# Patient Record
Sex: Male | Born: 1975 | Race: White | Hispanic: No | Marital: Single | Smoking: Current every day smoker
Health system: Southern US, Community
[De-identification: ages and names within clinical notes are randomized; demographics above are authoritative.]

## PROBLEM LIST (undated history)

## (undated) DIAGNOSIS — F1910 Other psychoactive substance abuse, uncomplicated: Secondary | ICD-10-CM

## (undated) DIAGNOSIS — Z789 Other specified health status: Secondary | ICD-10-CM

---

## 2000-08-26 ENCOUNTER — Encounter: Payer: Self-pay | Admitting: Emergency Medicine

## 2000-08-26 ENCOUNTER — Emergency Department (HOSPITAL_COMMUNITY): Admission: EM | Admit: 2000-08-26 | Discharge: 2000-08-27 | Payer: Self-pay | Admitting: Emergency Medicine

## 2012-03-14 ENCOUNTER — Encounter (HOSPITAL_COMMUNITY): Payer: Self-pay | Admitting: *Deleted

## 2012-03-14 ENCOUNTER — Emergency Department (HOSPITAL_COMMUNITY): Payer: Self-pay

## 2012-03-14 ENCOUNTER — Emergency Department (HOSPITAL_COMMUNITY)
Admission: EM | Admit: 2012-03-14 | Discharge: 2012-03-15 | Disposition: A | Discharge status: Home / Self Care | Payer: Self-pay | Attending: Emergency Medicine | Admitting: Emergency Medicine

## 2012-03-14 DIAGNOSIS — S20219A Contusion of unspecified front wall of thorax, initial encounter: Secondary | ICD-10-CM | POA: Insufficient documentation

## 2012-03-14 DIAGNOSIS — F17200 Nicotine dependence, unspecified, uncomplicated: Secondary | ICD-10-CM | POA: Insufficient documentation

## 2012-03-14 DIAGNOSIS — W1809XA Striking against other object with subsequent fall, initial encounter: Secondary | ICD-10-CM | POA: Insufficient documentation

## 2012-03-14 NOTE — ED Notes (Signed)
Pt states was on ladder and the ladder slipped, pt fell onto a handrail injuring bilateral chest. Pt states pain has worsened since fall x 2 days ago. Pt states sharp pain on R side w/ inspiration. Pt able to raise arms over head and is in mild acute distress, no respiratory distress at this time.

## 2012-03-15 MED ORDER — OXYCODONE-ACETAMINOPHEN 5-325 MG PO TABS: 2.0000 | ORAL_TABLET | Freq: Three times a day (TID) | ORAL | Status: AC | PRN

## 2012-03-15 NOTE — ED Notes (Signed)
Pt ambulates to room from triage, no s/s of acute distress or discomfort noted

## 2012-03-15 NOTE — ED Notes (Signed)
Placed fall risk bracelet on pt. Shoes remained on pt.

## 2012-03-15 NOTE — ED Provider Notes (Signed)
History     CSN: [id number]  Arrival date & time 03/14/12  2000   First MD Initiated Contact with Patient 03/15/12 0055      Chief Complaint  Patient presents with  . Fall    (Consider location/radiation/quality/duration/timing/severity/associated sxs/prior treatment) HPI This 36 year old male was trimming his mom's pushes well balancing on a ladder that slipped on a stair railing and started to fall over causing the patient to fall onto his anterior chest under the stair railing 2 days ago. He is anterior chest wall tenderness since that time. He no head or neck injury no amnesia no syncope. Is no neck pain back pain shortness breath or abdominal pain or pain to his extremities. He is no weakness or numbness or other concerns. Is not short of breath. His chest wall is moderately severe in terms of his pain and tenderness worse with position changes and no treatment prior to arrival. He is having difficulty sleeping at night and wants pain medicine to help sleep at night. History reviewed. No pertinent past medical history.  History reviewed. No pertinent past surgical history.  History reviewed. No pertinent family history.  History  Substance Use Topics  . Smoking status: Current Everyday Smoker -- 1.0 packs/day    Types: Cigarettes  . Smokeless tobacco: Not on file  . Alcohol Use: No      Review of Systems   10 Systems reviewed and are negative for acute change except as noted in the HPI. Allergies  Review of patient's allergies indicates no known allergies.  Home Medications   Current Outpatient Rx  Name Route Sig Dispense Refill  . OXYCODONE-ACETAMINOPHEN 5-325 MG PO TABS Oral Take 2 tablets by mouth every 8 (eight) hours as needed for pain. 20 tablet 0    BP 144/91  Pulse 76  Temp 98.8 F (37.1 C) (Oral)  Resp 18  SpO2 100%  Physical Exam  Nursing note and vitals reviewed. Constitutional:       Awake, alert, nontoxic appearance.  HENT:  Head: Atraumatic.   Eyes: Right eye exhibits no discharge. Left eye exhibits no discharge.  Neck: Neck supple.  Cardiovascular: Normal rate and regular rhythm.   No murmur heard. Pulmonary/Chest: Effort normal and breath sounds normal. No respiratory distress. He has no wheezes. He has no rales. He exhibits tenderness.       Bilateral anterior chest wall tenderness without instability or flail chest palpated and no external bruising noted  Abdominal: Soft. There is no tenderness. There is no rebound.  Musculoskeletal: He exhibits no tenderness.       Baseline ROM, no obvious new focal weakness.  Neurological:       Mental status and motor strength appears baseline for patient and situation.  Skin: No rash noted.  Psychiatric: He has a normal mood and affect.    ED Course  Procedures (including critical care time)  Labs Reviewed - No data to display Dg Chest 2 View  03/14/2012  *RADIOLOGY REPORT*  Clinical Data: Fall.  Left lateral side pain.  Bilateral pain. History of smoking.  CHEST - 2 VIEW  Comparison: None.  Findings: There is no pneumothorax.  No displaced rib fracture is identified.  Cardiopericardial silhouette is within normal limits. Basilar atelectasis.  Mild blunting of the right costophrenic angle may be due to scarring or atelectasis.  There is no pleural fluid identified on the lateral view.  Grossly, thoracic vertebral body height appears preserved.  Mediastinal contours are within normal limits.  Paraspinal  lines are normal.  IMPRESSION: No acute cardiopulmonary disease.  Mild basilar atelectasis.  Original Report Authenticated By: Andreas Newport, M.D.     1. Chest wall contusion       MDM  Pt stable in ED with no significant deterioration in condition.Patient / Family / Caregiver informed of clinical course, understand medical decision-making process, and agree with plan.         Hurman Horn, MD 03/15/12 [phone]

## 2014-12-29 ENCOUNTER — Emergency Department (HOSPITAL_COMMUNITY): Payer: Self-pay

## 2014-12-29 ENCOUNTER — Emergency Department (HOSPITAL_COMMUNITY)
Admission: EM | Admit: 2014-12-29 | Discharge: 2014-12-29 | Disposition: A | Discharge status: Home / Self Care | Payer: Self-pay | Attending: Emergency Medicine | Admitting: Emergency Medicine

## 2014-12-29 ENCOUNTER — Encounter (HOSPITAL_COMMUNITY): Payer: Self-pay | Admitting: Emergency Medicine

## 2014-12-29 DIAGNOSIS — Z720 Tobacco use: Secondary | ICD-10-CM | POA: Insufficient documentation

## 2014-12-29 DIAGNOSIS — R4182 Altered mental status, unspecified: Secondary | ICD-10-CM | POA: Insufficient documentation

## 2014-12-29 DIAGNOSIS — S01311A Laceration without foreign body of right ear, initial encounter: Secondary | ICD-10-CM | POA: Insufficient documentation

## 2014-12-29 DIAGNOSIS — Y998 Other external cause status: Secondary | ICD-10-CM | POA: Insufficient documentation

## 2014-12-29 DIAGNOSIS — Y9289 Other specified places as the place of occurrence of the external cause: Secondary | ICD-10-CM | POA: Insufficient documentation

## 2014-12-29 DIAGNOSIS — Y288XXA Contact with other sharp object, undetermined intent, initial encounter: Secondary | ICD-10-CM | POA: Insufficient documentation

## 2014-12-29 DIAGNOSIS — Y9389 Activity, other specified: Secondary | ICD-10-CM | POA: Insufficient documentation
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
## 2014-12-29 NOTE — Discharge Instructions (Signed)
Your laceration was not sutured per urethra last use date her tetanus immunization is up-to-date your head CT is normal.  [facility] [facility]

## 2019-07-18 ENCOUNTER — Emergency Department (HOSPITAL_BASED_OUTPATIENT_CLINIC_OR_DEPARTMENT_OTHER): Payer: Self-pay

## 2019-07-18 ENCOUNTER — Encounter (HOSPITAL_BASED_OUTPATIENT_CLINIC_OR_DEPARTMENT_OTHER): Payer: Self-pay | Admitting: Emergency Medicine

## 2019-07-18 ENCOUNTER — Inpatient Hospital Stay (HOSPITAL_BASED_OUTPATIENT_CLINIC_OR_DEPARTMENT_OTHER)
Admission: EM | Admit: 2019-07-18 | Discharge: 2019-07-19 | DRG: 917 | Discharge status: Against Medical Advice | Payer: Self-pay | Attending: Internal Medicine | Admitting: Internal Medicine

## 2019-07-18 DIAGNOSIS — G92 Toxic encephalopathy: Secondary | ICD-10-CM | POA: Diagnosis present

## 2019-07-18 DIAGNOSIS — E875 Hyperkalemia: Secondary | ICD-10-CM | POA: Diagnosis present

## 2019-07-18 DIAGNOSIS — F17210 Nicotine dependence, cigarettes, uncomplicated: Secondary | ICD-10-CM | POA: Diagnosis present

## 2019-07-18 DIAGNOSIS — G9340 Encephalopathy, unspecified: Secondary | ICD-10-CM | POA: Diagnosis present

## 2019-07-18 DIAGNOSIS — T50904A Poisoning by unspecified drugs, medicaments and biological substances, undetermined, initial encounter: Principal | ICD-10-CM | POA: Diagnosis present

## 2019-07-18 DIAGNOSIS — R0902 Hypoxemia: Secondary | ICD-10-CM | POA: Diagnosis present

## 2019-07-18 DIAGNOSIS — J690 Pneumonitis due to inhalation of food and vomit: Secondary | ICD-10-CM | POA: Diagnosis present

## 2019-07-18 DIAGNOSIS — Z20828 Contact with and (suspected) exposure to other viral communicable diseases: Secondary | ICD-10-CM | POA: Diagnosis present

## 2019-07-18 HISTORY — DX: Other specified health status: Z78.9

## 2019-07-18 LAB — SALICYLATE LEVEL: Salicylate Lvl: <7 mg/dL (ref 2.8–30.0)

## 2019-07-18 LAB — COMPREHENSIVE METABOLIC PANEL
ALT: 47 U/L — ABNORMAL HIGH (ref 0–44)
AST: 61 U/L — ABNORMAL HIGH (ref 15–41)
Albumin: 4.2 g/dL (ref 3.5–5.0)
Alkaline Phosphatase: 63 U/L (ref 38–126)
Anion gap: 11 (ref 5–15)
BUN: 9 mg/dL (ref 6–20)
CO2: 24 mmol/L (ref 22–32)
Calcium: 8.9 mg/dL (ref 8.9–10.3)
Chloride: 100 mmol/L (ref 98–111)
Creatinine, Ser: 1.31 mg/dL — ABNORMAL HIGH (ref 0.61–1.24)
GFR calc Af Amer: >60 mL/min (ref >60)
GFR calc non Af Amer: >60 mL/min (ref >60)
Glucose, Bld: 150 mg/dL — ABNORMAL HIGH (ref 70–99)
Potassium: 5.4 mmol/L — ABNORMAL HIGH (ref 3.5–5.1)
Sodium: 135 mmol/L (ref 135–145)
Total Bilirubin: 2 mg/dL — ABNORMAL HIGH (ref 0.3–1.2)
Total Protein: 7.4 g/dL (ref 6.5–8.1)

## 2019-07-18 LAB — CBC WITH DIFFERENTIAL/PLATELET
Abs Immature Granulocytes: 0.08 10*3/uL — ABNORMAL HIGH (ref 0.00–0.07)
Basophils Absolute: 0 10*3/uL (ref 0.0–0.1)
Basophils Relative: 0 %
Eosinophils Absolute: 0.1 10*3/uL (ref 0.0–0.5)
Eosinophils Relative: 0 %
HCT: 43.8 % (ref 39.0–52.0)
Hemoglobin: 14.6 g/dL (ref 13.0–17.0)
Immature Granulocytes: 1 %
Lymphocytes Relative: 5 %
Lymphs Abs: 0.8 10*3/uL (ref 0.7–4.0)
MCH: 30.4 pg (ref 26.0–34.0)
MCHC: 33.3 g/dL (ref 30.0–36.0)
MCV: 91.1 fL (ref 80.0–100.0)
Monocytes Absolute: 0.6 10*3/uL (ref 0.1–1.0)
Monocytes Relative: 4 %
Neutro Abs: 15.9 10*3/uL — ABNORMAL HIGH (ref 1.7–7.7)
Neutrophils Relative %: 90 %
Platelets: 327 10*3/uL (ref 150–400)
RBC: 4.81 MIL/uL (ref 4.22–5.81)
RDW: 12.2 % (ref 11.5–15.5)
WBC: 17.5 10*3/uL — ABNORMAL HIGH (ref 4.0–10.5)
nRBC: 0 % (ref 0.0–0.2)

## 2019-07-18 LAB — ETHANOL: Alcohol, Ethyl (B): <10 mg/dL (ref <10)

## 2019-07-18 LAB — RAPID URINE DRUG SCREEN, HOSP PERFORMED
Amphetamines: POSITIVE — AB
Barbiturates: NOT DETECTED
Benzodiazepines: POSITIVE — AB
Cocaine: NOT DETECTED
Opiates: NOT DETECTED
Tetrahydrocannabinol: NOT DETECTED

## 2019-07-18 LAB — ACETAMINOPHEN LEVEL: Acetaminophen (Tylenol), Serum: <10 ug/mL — ABNORMAL LOW (ref 10–30)

## 2019-07-18 LAB — SARS CORONAVIRUS 2 AG (30 MIN TAT): SARS Coronavirus 2 Ag: NEGATIVE

## 2019-07-18 MED ORDER — SODIUM CHLORIDE 0.9 % IV BOLUS
1000.0000 mL | Freq: Once | INTRAVENOUS | Status: AC
  Administered 2019-07-18: 18:00:00 1000 mL via INTRAVENOUS

## 2019-07-18 MED ORDER — SODIUM CHLORIDE 0.9 % IV BOLUS
1000.0000 mL | Freq: Once | INTRAVENOUS | Status: AC
  Administered 2019-07-18: 19:00:00 1000 mL via INTRAVENOUS

## 2019-07-18 MED ORDER — SODIUM CHLORIDE 0.9 % IV SOLN
3.0000 g | Freq: Four times a day (QID) | INTRAVENOUS | Status: DC
  Administered 2019-07-18 – 2019-07-19 (×2): 3 g via INTRAVENOUS
  Filled 2019-07-18 (×3): qty 8

## 2019-07-18 MED ORDER — NICOTINE 21 MG/24HR TD PT24
21.0000 mg | MEDICATED_PATCH | Freq: Once | TRANSDERMAL | Status: DC
  Administered 2019-07-19: 21 mg via TRANSDERMAL
  Filled 2019-07-18: qty 1

## 2019-07-18 NOTE — ED Notes (Signed)
Arouses easily, sleeping soundly, PA aware

## 2019-07-18 NOTE — Progress Notes (Signed)
Pharmacy Antibiotic Note  Christopher Sanders is a 43 y.o. male admitted on 07/18/2019 with aspiration PNA.  Pharmacy has been consulted for Unasyn dosing. SCr 1.31 on admit.  Plan: Unasyn 3g IV q6h Monitor clinical progress, c/s, renal function F/u de-escalation plan/LOT      Temp (24hrs), Avg:98.1 F (36.7 C), Min:98.1 F (36.7 C), Max:98.1 F (36.7 C)  Recent Labs  Lab 07/18/19 1751  WBC 17.5*  CREATININE 1.31*    CrCl cannot be calculated (Unknown ideal weight.).    No Known Allergies   Elicia Lamp, PharmD, BCPS Please check AMION for all [facility] contact numbers Clinical Pharmacist 07/18/2019 7:29 PM

## 2019-07-18 NOTE — ED Notes (Addendum)
[facility] notified (Taryn) - patient ready for transport 
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
## 2019-07-18 NOTE — ED Notes (Signed)
Spoke with Agricultural consultant at [employer]. States that they will collect the covid swap on pt arrival. Pt updated on POC. No distress noted. Pt agreeable to POC
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
## 2019-07-19 ENCOUNTER — Other Ambulatory Visit: Payer: Self-pay

## 2019-07-19 ENCOUNTER — Encounter (HOSPITAL_COMMUNITY): Payer: Self-pay | Admitting: Internal Medicine

## 2019-07-19 DIAGNOSIS — G9340 Encephalopathy, unspecified: Secondary | ICD-10-CM

## 2019-07-19 DIAGNOSIS — T50904A Poisoning by unspecified drugs, medicaments and biological substances, undetermined, initial encounter: Principal | ICD-10-CM

## 2019-07-19 DIAGNOSIS — J690 Pneumonitis due to inhalation of food and vomit: Secondary | ICD-10-CM

## 2019-07-19 LAB — MRSA PCR SCREENING: MRSA by PCR: NEGATIVE

## 2019-07-19 MED ORDER — CHLORHEXIDINE GLUCONATE CLOTH 2 % EX PADS: 6.0000 | MEDICATED_PAD | Freq: Every day | CUTANEOUS | Status: DC

## 2019-07-19 MED ORDER — ONDANSETRON HCL 4 MG PO TABS: 4.0000 mg | ORAL_TABLET | Freq: Four times a day (QID) | ORAL | Status: DC | PRN

## 2019-07-19 MED ORDER — NICOTINE 21 MG/24HR TD PT24
MEDICATED_PATCH | TRANSDERMAL | Status: AC
  Administered 2019-07-19: 21 mg
  Filled 2019-07-19: qty 1

## 2019-07-19 MED ORDER — ORAL CARE MOUTH RINSE
15.0000 mL | Freq: Two times a day (BID) | OROMUCOSAL | Status: DC
  Administered 2019-07-19: 15 mL via OROMUCOSAL

## 2019-07-19 MED ORDER — SODIUM CHLORIDE 0.9 % IV SOLN
2.0000 g | Freq: Every day | INTRAVENOUS | Status: DC
  Administered 2019-07-19: 05:00:00 2 g via INTRAVENOUS
  Filled 2019-07-19: qty 20

## 2019-07-19 MED ORDER — ACETAMINOPHEN 650 MG RE SUPP: 650.0000 mg | Freq: Four times a day (QID) | RECTAL | Status: DC | PRN

## 2019-07-19 MED ORDER — ENOXAPARIN SODIUM 40 MG/0.4ML ~~LOC~~ SOLN: 40.0000 mg | Freq: Every morning | SUBCUTANEOUS | Status: DC

## 2019-07-19 MED ORDER — ACETAMINOPHEN 325 MG PO TABS: 650.0000 mg | ORAL_TABLET | Freq: Four times a day (QID) | ORAL | Status: DC | PRN

## 2019-07-19 MED ORDER — SODIUM CHLORIDE 0.9 % IV SOLN: INTRAVENOUS | Status: DC

## 2019-07-19 MED ORDER — SODIUM CHLORIDE 0.9 % IV SOLN
500.0000 mg | Freq: Every day | INTRAVENOUS | Status: DC
  Administered 2019-07-19: 500 mg via INTRAVENOUS
  Filled 2019-07-19: qty 500

## 2019-07-19 MED ORDER — ONDANSETRON HCL 4 MG/2ML IJ SOLN: 4.0000 mg | Freq: Four times a day (QID) | INTRAMUSCULAR | Status: DC | PRN

## 2019-07-19 NOTE — Discharge Summary (Signed)
Physician Discharge Summary  Custer Pimenta TMA:[id number] DOB: 1975/09/03 DOA: 07/18/2019  PCP: Patient, No Pcp Per  Admit date: 07/18/2019 Discharge date: 07/19/2019  Admitted From: Home  Disposition: Left AMA   Recommendations for Outpatient Follow-up: Outpatient follow-up appointment cannot be made because patient absconded  Home Health:No Equipment/Devices: None  Discharge Condition: Guarded  CODE STATUS: Full code  Diet recommendation: Regular diet Brief/Interim Summary:  Patient is a 43 y.o. male with no significant chronic medical history known to him.  Patient acknowledged using recreational drugs yesterday but is reluctant to give details of exactly what he took.  The last thing he recollects was leaving his hotel room.  Patient also reported that he lives out of town and did not disclose the reasons why he came to visit.    He was found unresponsive in his car at a gas station.  It is reported that patient had agonal breathing and he responded partially to IV Narcan.  He denies fever, nausea, vomiting, cough, chest pain, orthopnea, PND, shortness of breath, headache.  On assessment at the ED, he had significant leukocytosis and chest radiographic concerning for pneumonia.  Patient was therefore admitted for further management as a transfer from [facility] emergency department.  He was started on IV fluids and antibiotics Unasyn for possible aspiration pneumonia.  Shortly after arrival to the stepdown unit, patient eloped from his room.  He did not wait to sign AMA forms or wait for medication reconciliation.  Hospital police was notified of this development.  Discharge Diagnoses:  Active Problems:   Acute encephalopathy    Discharge Instructions   Allergies as of 07/19/2019   No Known Allergies     Medication List    You have not been prescribed any medications.     No Known Allergies  Consultations: None   Procedures/Studies: CT Head Wo  Contrast  Result Date: 07/18/2019 CLINICAL DATA:  Unresponsive/altered mental status EXAM: CT HEAD WITHOUT CONTRAST TECHNIQUE: Contiguous axial images were obtained from the base of the skull through the vertex without intravenous contrast. COMPARISON:  Dec 29, 2014 FINDINGS: Brain: Ventricles are normal in size and configuration. There is no intracranial mass, hemorrhage, extra-axial fluid collection, or midline shift. Brain parenchyma appears unremarkable. No evident acute infarct. Vascular: No hyperdense vessel. No appreciable vascular calcification. Skull: The bony calvarium appears intact. Sinuses/Orbits: There is mucosal thickening in several ethmoid air cells. Other visualized paranasal sinuses are clear. Visualized orbits appear symmetric bilaterally. Other: Visualized mastoid air cells are clear. IMPRESSION: Brain parenchyma appears unremarkable.  No mass or hemorrhage. Mucosal thickening noted in several ethmoid air cells. Electronically Signed   By: Lowella Grip III M.D.   On: 07/18/2019 19:06   DG Chest Portable 1 View  Result Date: 07/18/2019 CLINICAL DATA:  Altered mental status with questionable drug overdose EXAM: PORTABLE CHEST 1 VIEW COMPARISON:  March 14, 2012 FINDINGS: There is ill-defined opacity in the right base. Lungs elsewhere are clear. Heart is upper normal in size with pulmonary vascularity normal. No adenopathy. No bone lesions. IMPRESSION: Rather subtle ill-defined opacity right base. Suspect early pneumonia versus aspiration in this area. Lungs elsewhere clear. Heart upper normal in size. No adenopathy evident. Electronically Signed   By: Lowella Grip III M.D.   On: 07/18/2019 19:07      Subjective:   Discharge Exam:  Vitals:   07/19/19 0400 07/19/19 0510  BP: (!) 168/129 (!) 168/100  Pulse: 96 (!) 107  Resp: 20 15  Temp:  SpO2: 98% 95%   Vitals:   07/19/19 0249 07/19/19 0300 07/19/19 0400 07/19/19 0510  BP: 121/79 138/85 (!) 168/129 (!) 168/100   Pulse: 85 (!) 110 96 (!) 107  Resp: _0 Temp: (!) 96.2 F (35.7 C)     TempSrc: Oral     SpO2:  (!) 87% 98% 95%  Weight: 82.5 kg     Height: _1  (1.854 m)       Patient was not examined by me prior to leaving AMA    The results of significant diagnostics from this hospitalization (including imaging, microbiology, ancillary and laboratory) are listed below for reference.     Microbiology: Recent Results (from the past 240 hour(s))  SARS Coronavirus 2 Ag (30 min TAT) - Nasal Swab (BD Veritor Kit)     Status: None   Collection Time: 07/18/19  8:05 PM   Specimen: Nasal Swab (BD Veritor Kit)  Result Value Ref Range Status   SARS Coronavirus 2 Ag NEGATIVE NEGATIVE Final    Comment: (NOTE) SARS-CoV-2 antigen NOT DETECTED.  Negative results are presumptive.  Negative results do not preclude SARS-CoV-2 infection and should not be used as the sole basis for treatment or other patient management decisions, including infection  control decisions, particularly in the presence of clinical signs and  symptoms consistent with COVID-19, or in those who have been in contact with the virus.  Negative results must be combined with clinical observations, patient history, and epidemiological information. The expected result is Negative. Fact Sheet for Patients: PodPark.tn Fact Sheet for Healthcare Providers: GiftContent.is This test is not yet approved or cleared by the [place] FDA and  has been authorized for detection and/or diagnosis of SARS-CoV-2 by FDA under an Emergency Use Authorization (EUA).  This EUA will remain in effect (meaning this test can be used) for the duration of  the COVID-19 de claration under Section 564(b)(1) of the Act, 21 U.S.C. section 360bbb-3(b)(1), unless the authorization is terminated or revoked sooner. Performed at [facility], [address] [place], [place] [address]    MRSA PCR Screening     Status: None   Collection Time: 07/19/19  1:17 AM   Specimen: Nasal Mucosa; Nasopharyngeal  Result Value Ref Range Status   MRSA by PCR NEGATIVE NEGATIVE Final    Comment:        The GeneXpert MRSA Assay (FDA approved for NASAL specimens only), is one component of a comprehensive MRSA colonization surveillance program. It is not intended to diagnose MRSA infection nor to guide or monitor treatment for MRSA infections. Performed at [facility], [place] [address], [place], [place] [address]      Labs: BNP (last 3 results) No results for input(s): BNP in the last 8760 hours. Basic Metabolic Panel: Recent Labs  Lab 07/18/19 1751  NA 135  K 5.4*  CL 100  CO2 24  GLUCOSE 150*  BUN 9  CREATININE 1.31*  CALCIUM 8.9   Liver Function Tests: Recent Labs  Lab 07/18/19 1751  AST 61*  ALT 47*  ALKPHOS 63  BILITOT 2.0*  PROT 7.4  ALBUMIN 4.2   No results for input(s): LIPASE, AMYLASE in the last 168 hours. No results for input(s): AMMONIA in the last 168 hours. CBC: Recent Labs  Lab 07/18/19 1751  WBC 17.5*  NEUTROABS 15.9*  HGB 14.6  HCT 43.8  MCV 91.1  PLT 327   Cardiac Enzymes: No results for input(s): CKTOTAL, CKMB,  CKMBINDEX, TROPONINI in the last 168 hours. BNP: Invalid input(s): POCBNP CBG: No results for input(s): GLUCAP in the last 168 hours. D-Dimer No results for input(s): DDIMER in the last 72 hours. Hgb A1c No results for input(s): HGBA1C in the last 72 hours. Lipid Profile No results for input(s): CHOL, HDL, LDLCALC, TRIG, CHOLHDL, LDLDIRECT in the last 72 hours. Thyroid function studies No results for input(s): TSH, T4TOTAL, T3FREE, THYROIDAB in the last 72 hours.  Invalid input(s): FREET3 Anemia work up No results for input(s): VITAMINB12, FOLATE, FERRITIN, TIBC, IRON, RETICCTPCT in the last 72 hours. Urinalysis No results found for: COLORURINE, APPEARANCEUR, [place], [place], [place], [place], [place], PROTEINUR, UROBILINOGEN, NITRITE, LEUKOCYTESUR Sepsis Labs Invalid input(s): PROCALCITONIN,  WBC,  LACTICIDVEN Microbiology Recent Results (from the past 240 hour(s))  SARS Coronavirus 2 Ag (30 min TAT) - Nasal Swab (BD Veritor Kit)     Status: None   Collection Time: 07/18/19  8:05 PM   Specimen: Nasal Swab (BD Veritor Kit)  Result Value Ref Range Status   SARS Coronavirus 2 Ag NEGATIVE NEGATIVE Final    Comment: (NOTE) SARS-CoV-2 antigen NOT DETECTED.  Negative results are presumptive.  Negative results do not preclude SARS-CoV-2 infection and should not be used as the sole basis for treatment or other patient management decisions, including infection  control decisions, particularly in the presence of clinical signs and  symptoms consistent with COVID-19, or in those who have been in contact with the virus.  Negative results must be combined with clinical observations, patient history, and epidemiological information. The expected result is Negative. Fact Sheet for Patients: PodPark.tn Fact Sheet for Healthcare Providers: GiftContent.is This test is not yet approved or cleared by the [place] FDA and  has been authorized for detection and/or diagnosis of SARS-CoV-2 by FDA under an Emergency Use Authorization (EUA).  This EUA will remain in effect (meaning this test can be used) for the duration of  the COVID-19 de claration under Section 564(b)(1) of the Act, 21 U.S.C. section 360bbb-3(b)(1), unless the authorization is terminated or revoked sooner. Performed at [facility], [address] [place], [place] [address]   MRSA PCR Screening     Status: None   Collection Time: 07/19/19  1:17 AM   Specimen: Nasal Mucosa; Nasopharyngeal  Result Value Ref Range Status   MRSA by PCR NEGATIVE NEGATIVE Final    Comment:        The GeneXpert MRSA Assay (FDA approved for NASAL  specimens only), is one component of a comprehensive MRSA colonization surveillance program. It is not intended to diagnose MRSA infection nor to guide or monitor treatment for MRSA infections. Performed at [facility], [place] [address] [place], [place] [address]      Time coordinating discharge: Over 30 minutes  SIGNED:   Phineas Semen, MD  [facility] 07/19/2019, 6:35 AM Pager   If 7PM-7AM, please contact night-coverage [email or web address] Password [email or web address]

## 2019-07-19 NOTE — Progress Notes (Signed)
At approximately 0545, I was notified that patient had absconded from the hospital without signing  [place] documentations.  He pulled out his IV access and did not wait to be evaluated by me prior to leaving the premises.  I was unable to educate patient on the potential risk and complications of physician.  Security was notified.

## 2019-07-19 NOTE — H&P (Signed)
History and Physical    Christopher BondJason Sanders ZOX:[id number] DOB: 10/09/1975 DOA: 07/18/2019  PCP: Patient, No Pcp Per  Patient coming from: Hotel.  Patient did not want to disclose his address  Chief Complaint: Mental status changes  HPI: Christopher BondJason Sanders is a 43 y.o. male with no significant chronic medical history known to him.  I should highlights that, patient does not recollect exactly what happened and hence could not give a reliable history.  Patient acknowledged using recreational drugs yesterday but is reluctant to give details of exactly what he took.  The last thing he recollects was leaving his hotel room.  Patient also reported that he lives out of town and did not disclose the reason why he came to visit.  He was found unresponsive in his car at a gas station.  It is reported that patient had agonal breathing and he responded partially to IV Narcan.  He denies fever, nausea, vomiting, cough, chest pain, orthopnea, PND, shortness of breath, headache or loss of consciousness    ED Course: In the ED, he was hemodynamically stable and afebrile.  His work-up revealed leukocytosis 17,500 hyperkalemia and chest radiography was concerning for pneumonia.  Patient reportedly had episodes of hypoxia with oxygen saturation of 88% in the ED.  Review of Systems: As per HPI otherwise 10 point review of systems negative.   Past Medical History:  Diagnosis Date  . Medical history non-contributory     History reviewed. No pertinent surgical history.   reports that he has been smoking cigarettes. He has been smoking about 1.00 pack per day. His smokeless tobacco use includes snuff. He reports that he does not drink alcohol or use drugs.  No Known Allergies  History reviewed. No pertinent family history. Family history: Negative for respiratory failure  Prior to Admission medications   Not on File    Physical Exam: Vitals:   07/19/19 0119 07/19/19 0200 07/19/19 0246 07/19/19 0249  BP:  121/79 121/79  121/79  Pulse:  85 85 85  Resp:  16 16 16   Temp:  (!) 96.2 F (35.7 C) (!) 96.2 F (35.7 C) (!) 96.2 F (35.7 C)  TempSrc:  Oral Oral Oral  SpO2:  98%    Weight:    82.5 kg  Height: 6\' 1"  (1.854 m)  6\' 1"  (1.854 m) 6\' 1"  (1.854 m)    Constitutional: NAD, calm, comfortable Vitals:   07/19/19 0119 07/19/19 0200 07/19/19 0246 07/19/19 0249  BP:  121/79 121/79 121/79  Pulse:  85 85 85  Resp:  16 16 16   Temp:  (!) 96.2 F (35.7 C) (!) 96.2 F (35.7 C) (!) 96.2 F (35.7 C)  TempSrc:  Oral Oral Oral  SpO2:  98%    Weight:    82.5 kg  Height: 6\' 1"  (1.854 m)  6\' 1"  (1.854 m) 6\' 1"  (1.854 m)   Eyes: PERRL, lids and conjunctivae normal ENMT: Mucous membranes are moist. Posterior pharynx clear of any exudate or lesions.Normal dentition.  Neck: normal, supple, no masses, no thyromegaly Respiratory: clear to auscultation bilaterally, no wheezing, no crackles. Normal respiratory effort. No accessory muscle use.  Cardiovascular: Regular rate and rhythm, no murmurs / rubs / gallops. No extremity edema. 2+ pedal pulses. No carotid bruits.  Abdomen: no tenderness, no masses palpated. No hepatosplenomegaly. Bowel sounds positive.  Musculoskeletal: no clubbing / cyanosis. No joint deformity upper and lower extremities. Good ROM, no contractures. Normal muscle tone.  Skin: no rashes, lesions, ulcers. No induration Neurologic: CN  2-12 grossly intact. Sensation intact, DTR normal. Strength 5/5 in all 4.  Psychiatric: Normal judgment and insight. Alert and oriented x 3. Normal mood.   Labs on Admission: I have personally reviewed following labs and imaging studies  CBC: Recent Labs  Lab 07/18/19 1751  WBC 17.5*  NEUTROABS 15.9*  HGB 14.6  HCT 43.8  MCV 91.1  PLT 327   Basic Metabolic Panel: Recent Labs  Lab 07/18/19 1751  NA 135  K 5.4*  CL 100  CO2 24  GLUCOSE 150*  BUN 9  CREATININE 1.31*  CALCIUM 8.9   GFR: Estimated Creatinine Clearance: 83.9 mL/min (A) (by C-G formula  based on SCr of 1.31 mg/dL (H)). Liver Function Tests: Recent Labs  Lab 07/18/19 1751  AST 61*  ALT 47*  ALKPHOS 63  BILITOT 2.0*  PROT 7.4  ALBUMIN 4.2   No results for input(s): LIPASE, AMYLASE in the last 168 hours. No results for input(s): AMMONIA in the last 168 hours. Coagulation Profile: No results for input(s): INR, PROTIME in the last 168 hours. Cardiac Enzymes: No results for input(s): CKTOTAL, CKMB, CKMBINDEX, TROPONINI in the last 168 hours. BNP (last 3 results) No results for input(s): PROBNP in the last 8760 hours. HbA1C: No results for input(s): HGBA1C in the last 72 hours. CBG: No results for input(s): GLUCAP in the last 168 hours. Lipid Profile: No results for input(s): CHOL, HDL, LDLCALC, TRIG, CHOLHDL, LDLDIRECT in the last 72 hours. Thyroid Function Tests: No results for input(s): TSH, T4TOTAL, FREET4, T3FREE, THYROIDAB in the last 72 hours. Anemia Panel: No results for input(s): VITAMINB12, FOLATE, FERRITIN, TIBC, IRON, RETICCTPCT in the last 72 hours. Urine analysis: No results found for: COLORURINE, APPEARANCEUR, LABSPEC, PHURINE, GLUCOSEU, HGBUR, BILIRUBINUR, KETONESUR, PROTEINUR, UROBILINOGEN, NITRITE, LEUKOCYTESUR  Radiological Exams on Admission: CT Head Wo Contrast  Result Date: 07/18/2019 CLINICAL DATA:  Unresponsive/altered mental status EXAM: CT HEAD WITHOUT CONTRAST TECHNIQUE: Contiguous axial images were obtained from the base of the skull through the vertex without intravenous contrast. COMPARISON:  Dec 29, 2014 FINDINGS: Brain: Ventricles are normal in size and configuration. There is no intracranial mass, hemorrhage, extra-axial fluid collection, or midline shift. Brain parenchyma appears unremarkable. No evident acute infarct. Vascular: No hyperdense vessel. No appreciable vascular calcification. Skull: The bony calvarium appears intact. Sinuses/Orbits: There is mucosal thickening in several ethmoid air cells. Other visualized paranasal  sinuses are clear. Visualized orbits appear symmetric bilaterally. Other: Visualized mastoid air cells are clear. IMPRESSION: Brain parenchyma appears unremarkable.  No mass or hemorrhage. Mucosal thickening noted in several ethmoid air cells. Electronically Signed   By: Bretta Bang III M.D.   On: 07/18/2019 19:06   DG Chest Portable 1 View  Result Date: 07/18/2019 CLINICAL DATA:  Altered mental status with questionable drug overdose EXAM: PORTABLE CHEST 1 VIEW COMPARISON:  March 14, 2012 FINDINGS: There is ill-defined opacity in the right base. Lungs elsewhere are clear. Heart is upper normal in size with pulmonary vascularity normal. No adenopathy. No bone lesions. IMPRESSION: Rather subtle ill-defined opacity right base. Suspect early pneumonia versus aspiration in this area. Lungs elsewhere clear. Heart upper normal in size. No adenopathy evident. Electronically Signed   By: Bretta Bang III M.D.   On: 07/18/2019 19:07    EKG: Independently reviewed.  Sinus rhythm with no acute ischemic change  Assessment/Plan Active Problems:   Acute encephalopathy  1.  Acute toxic encephalopathy: From history, patient probably took unspecified amount of recreational drugs.  During assessment at bedside, he was  more alert and oriented.  2.  Possible pneumonia: Clinically, patient denies any fever or cardiopulmonary symptoms.Marland Kitchen He will be covered empirically with ceftriaxone and azithromycin.  We will give supplemental oxygen and monitor closely.  3.  Hyperkalemia: No EKG changes.  Repeat potassium levels and evaluated, will medically correct with insulin, dextrose, calcium gluconate.  4.  Substance abuse: Patient is reluctant to give any details of drug abuse.  Monitor patient closely  5.  Hypoxia: This is secondary to possible respiratory depression from recreational drug and pneumonia.  We will give supplemental oxygen monitor patient closely  DVT prophylaxis: Lovenox  Code Status: Full  code Family Communication: Patient did not provide any family information  Disposition Plan: Anticipated discharge home  Consults called: None Admission status: Inpatient to stepdown   Phineas Semen MD [facility] Pager [phone]  If 7PM-7AM, please contact night-coverage [email or web address] Password [email or web address]  07/19/2019, 3:11 AM

## 2019-07-19 NOTE — Progress Notes (Addendum)
Pt. Ran out of his room RN called security he ran out of the back stairwell. Security was unable to find pt. And said that those stairwells led outside of the hospital so he more than likely left.

## 2019-09-05 ENCOUNTER — Emergency Department (HOSPITAL_COMMUNITY): Payer: Self-pay

## 2019-09-05 ENCOUNTER — Inpatient Hospital Stay (HOSPITAL_COMMUNITY)
Admission: EM | Admit: 2019-09-05 | Discharge: 2019-09-06 | DRG: 917 | Discharge status: Against Medical Advice | Payer: Self-pay | Attending: Pulmonary Disease | Admitting: Pulmonary Disease

## 2019-09-05 ENCOUNTER — Encounter (HOSPITAL_COMMUNITY): Payer: Self-pay | Admitting: Emergency Medicine

## 2019-09-05 DIAGNOSIS — R001 Bradycardia, unspecified: Secondary | ICD-10-CM | POA: Diagnosis present

## 2019-09-05 DIAGNOSIS — R451 Restlessness and agitation: Secondary | ICD-10-CM | POA: Diagnosis not present

## 2019-09-05 DIAGNOSIS — Z01818 Encounter for other preprocedural examination: Secondary | ICD-10-CM

## 2019-09-05 DIAGNOSIS — Z7151 Drug abuse counseling and surveillance of drug abuser: Secondary | ICD-10-CM

## 2019-09-05 DIAGNOSIS — J9602 Acute respiratory failure with hypercapnia: Secondary | ICD-10-CM | POA: Diagnosis present

## 2019-09-05 DIAGNOSIS — F17210 Nicotine dependence, cigarettes, uncomplicated: Secondary | ICD-10-CM | POA: Diagnosis present

## 2019-09-05 DIAGNOSIS — J9601 Acute respiratory failure with hypoxia: Secondary | ICD-10-CM | POA: Diagnosis present

## 2019-09-05 DIAGNOSIS — G9349 Other encephalopathy: Secondary | ICD-10-CM | POA: Diagnosis present

## 2019-09-05 DIAGNOSIS — E876 Hypokalemia: Secondary | ICD-10-CM | POA: Diagnosis present

## 2019-09-05 DIAGNOSIS — R4189 Other symptoms and signs involving cognitive functions and awareness: Secondary | ICD-10-CM

## 2019-09-05 DIAGNOSIS — T50991A Poisoning by other drugs, medicaments and biological substances, accidental (unintentional), initial encounter: Principal | ICD-10-CM | POA: Diagnosis present

## 2019-09-05 DIAGNOSIS — T50901A Poisoning by unspecified drugs, medicaments and biological substances, accidental (unintentional), initial encounter: Secondary | ICD-10-CM | POA: Diagnosis present

## 2019-09-05 DIAGNOSIS — T50904A Poisoning by unspecified drugs, medicaments and biological substances, undetermined, initial encounter: Secondary | ICD-10-CM

## 2019-09-05 DIAGNOSIS — Z20822 Contact with and (suspected) exposure to covid-19: Secondary | ICD-10-CM | POA: Diagnosis present

## 2019-09-05 DIAGNOSIS — Z781 Physical restraint status: Secondary | ICD-10-CM

## 2019-09-05 DIAGNOSIS — R1110 Vomiting, unspecified: Secondary | ICD-10-CM | POA: Diagnosis not present

## 2019-09-05 HISTORY — DX: Other psychoactive substance abuse, uncomplicated: F19.10

## 2019-09-05 LAB — CBC
HCT: 43.5 % (ref 39.0–52.0)
Hemoglobin: 14.4 g/dL (ref 13.0–17.0)
MCH: 29.7 pg (ref 26.0–34.0)
MCHC: 33.1 g/dL (ref 30.0–36.0)
MCV: 89.7 fL (ref 80.0–100.0)
Platelets: 337 10*3/uL (ref 150–400)
RBC: 4.85 MIL/uL (ref 4.22–5.81)
RDW: 12.3 % (ref 11.5–15.5)
WBC: 12.3 10*3/uL — ABNORMAL HIGH (ref 4.0–10.5)
nRBC: 0 % (ref 0.0–0.2)

## 2019-09-05 LAB — POCT I-STAT 7, (LYTES, BLD GAS, ICA,H+H)
Acid-Base Excess: 5 mmol/L — ABNORMAL HIGH (ref 0.0–2.0)
Bicarbonate: 29.9 mmol/L — ABNORMAL HIGH (ref 20.0–28.0)
Calcium, Ion: 1.26 mmol/L (ref 1.15–1.40)
HCT: 37 % — ABNORMAL LOW (ref 39.0–52.0)
Hemoglobin: 12.6 g/dL — ABNORMAL LOW (ref 13.0–17.0)
O2 Saturation: 100 %
Patient temperature: 95.4
Potassium: 3.5 mmol/L (ref 3.5–5.1)
Sodium: 133 mmol/L — ABNORMAL LOW (ref 135–145)
TCO2: 31 mmol/L (ref 22–32)
pCO2 arterial: 39.5 mmHg (ref 32.0–48.0)
pH, Arterial: 7.48 — ABNORMAL HIGH (ref 7.350–7.450)
pO2, Arterial: 246 mmHg — ABNORMAL HIGH (ref 83.0–108.0)

## 2019-09-05 LAB — CBG MONITORING, ED: Glucose-Capillary: 112 mg/dL — ABNORMAL HIGH (ref 70–99)

## 2019-09-05 MED ORDER — SODIUM CHLORIDE 0.9 % IV BOLUS (SEPSIS)
1000.0000 mL | Freq: Once | INTRAVENOUS | Status: AC
  Administered 2019-09-05: 1000 mL via INTRAVENOUS

## 2019-09-05 MED ORDER — ONDANSETRON HCL 4 MG/2ML IJ SOLN
INTRAMUSCULAR | Status: AC
  Filled 2019-09-05: qty 2

## 2019-09-05 MED ORDER — SODIUM CHLORIDE 0.9 % IV SOLN: INTRAVENOUS | Status: DC

## 2019-09-05 MED ORDER — NALOXONE HCL 2 MG/2ML IJ SOSY
PREFILLED_SYRINGE | INTRAMUSCULAR | Status: AC
  Filled 2019-09-05: qty 2

## 2019-09-05 MED ORDER — ONDANSETRON HCL 4 MG/2ML IJ SOLN
4.0000 mg | Freq: Once | INTRAMUSCULAR | Status: DC
  Filled 2019-09-05: qty 2

## 2019-09-05 MED ORDER — NALOXONE HCL 0.4 MG/ML IJ SOLN
0.4000 mg | Freq: Once | INTRAMUSCULAR | Status: AC
  Administered 2019-09-05: 2 mg via INTRAVENOUS

## 2019-09-05 MED ORDER — PROPOFOL 1000 MG/100ML IV EMUL
INTRAVENOUS | Status: AC
  Filled 2019-09-05: qty 100

## 2019-09-05 NOTE — ED Provider Notes (Signed)
TIME SEEN: 11:33 PM  CHIEF COMPLAINT: Unresponsive  HPI: Patient is a 44 year old male with history of polysubstance abuse who presents to the emergency department unresponsive.  Reportedly friend called EMS after patient was found unresponsive in the bathroom.  Known ingestion of heroin and GHB.  Unknown amount or time of ingestion.  Unknown downtime.  Reportedly friend gave 1.2 mg of IM Narcan at the scene.  EMS gave another 0.5 mg IV Narcan x2 without much relief.  Patient cool to touch and intermittently bradycardic into the 30s.  EMS reports they thought he may have been in a second-degree heart block.  Had intermittent apnea.  Arrives on nonrebreather with right NPA.  Blood glucose in the 150s with EMS.  EMS also reports that he had intermittent posturing.  Patient has been vomiting.  On review of records, patient was admitted to the hospital 07/18/2019 and left AMA 07/19/2019 after he was found unresponsive in his car at a gas station.  Patient admitted for possible aspiration pneumonia.   Ladona Ridgel -  [phone] - brother   ROS: Level 5 caveat for altered mental status  PAST MEDICAL HISTORY/PAST SURGICAL HISTORY:  Past Medical History:  Diagnosis Date  . Medical history non-contributory   . Polysubstance abuse (HCC)     MEDICATIONS:  Prior to Admission medications   Not on File    ALLERGIES:  No Known Allergies  SOCIAL HISTORY:  Social History   Tobacco Use  . Smoking status: Current Every Day Smoker    Packs/day: 1.00    Types: Cigarettes  . Smokeless tobacco: Current User    Types: Snuff  Substance Use Topics  . Alcohol use: No    FAMILY HISTORY: No family history on file.  EXAM: BP (!) 150/100 (BP Location: Left Arm)   Pulse 77   Temp (!) 95.4 F (35.2 C) (Rectal)   Resp 10   Wt 82 kg   SpO2 98%   BMI 23.85 kg/m  CONSTITUTIONAL: Unresponsive.  Intermittently patient will appear to localize to painful stimuli with his bilateral upper extremities.  He  does not open his eyes, answer questions, follow commands.  He will grunt intermittently. HEAD: Normocephalic; atraumatic EYES: Conjunctivae clear, PERRL, EOMI, pupils are approximately 3 to 4 mm and reactive bilaterally ENT: normal nose; no rhinorrhea; moist mucous membranes; pharynx without lesions noted; no dental injury; no septal hematoma; R NPA in place NECK: Supple, no meningismus, no LAD; no midline step-off or deformity; trachea midline, cervical collar in place CARD: RRR but intermittently bradycardic into the 50s; S1 and S2 appreciated; no murmurs, no clicks, no rubs, no gallops RESP: Normal chest excursion without splinting or tachypnea; breath sounds clear and equal bilaterally; no wheezes, no rhonchi, no rales; no hypoxia or respiratory distress CHEST:  chest wall stable, no crepitus or ecchymosis or deformity ABD/GI: Normal bowel sounds; non-distended; soft; no ecchymosis or other lesions noted PELVIS:  Stable GU: Normal male genitalia without signs of trauma BACK:  The back appears normal, no midline step-off or deformity, no ecchymosis or swelling, no redness or warmth EXT: Compartments soft.  No bony deformity noted.  Extremities cool to touch. SKIN: Normal color for age and race; extremities cool to touch NEURO: GCS 8.  Patient will intermittently localize to painful stimuli.  No posturing witnessed here in the ED.  Does not open eyes.  Intermittently will grunt.  MEDICAL DECISION MAKING: Patient here with overdose.  Recently admitted to the hospital for the same.  Blood glucose here  normal.  No signs of trauma on exam.  Rectal temp 95.4.  Will place patient under [employer]  EMS reports that he was found inside of the house and was not outside.  Patient given another 2 mg of IV Narcan here with very minimal response.  Given GCS of 8, decision made to intubate patient for airway protection.  Sedated on propofol infusion.  Will obtain labs, urine, CT of the head and cervical  spine.  He will need ICU admission.  ED PROGRESS: Work-up here reveals mild leukocytosis.  Otherwise labs, blood gas unremarkable.  CT head and cervical spine unremarkable.  Chest x-ray clear.  Urine pending.  Will discuss with critical care for admission.  1:09 AM  Left message for brother, Ladona Ridgel.  1:24 AM  Updated brother Ladona Ridgel by phone.  1:50 AM Discussed patient's case with CCM, Dr. Dr. Arsenio Loader.  I have recommended admission and patient (and family if present) agree with this plan. Admitting physician will place admission orders.   I reviewed all nursing notes, vitals, pertinent previous records and interpreted all EKGs, lab and urine results, imaging (as available).   1:55 AM  Nurse reports patient starting to become more combative despite propofol.  Will order IV Versed infusion.    EKG Interpretation  Date/Time:  Monday September 05 2019 23:21:31 EST Ventricular Rate:  55 PR Interval:    QRS Duration: 100 QT Interval:  433 QTC Calculation: 415 R Axis:   90 Text Interpretation: Sinus rhythm Prolonged PR interval Borderline right axis deviation Baseline wander in lead(s) V3 V5 V6 No significant change since last tracing Confirmed by Rochele Raring [phone]) on 09/05/2019 11:33:59 PM      Procedure Name: Intubation Date/Time: 09/05/2019 11:45 PM Performed by: Jency Schnieders, Layla Maw, DO Pre-anesthesia Checklist: Patient identified, Patient being monitored, Emergency Drugs available, Timeout performed and Suction available Oxygen Delivery Method: Non-rebreather mask Preoxygenation: Pre-oxygenation with 100% oxygen Induction Type: Rapid sequence Ventilation: Mask ventilation without difficulty Laryngoscope Size: Glidescope and 3 Tube size: 7.5 mm Number of attempts: 1 Placement Confirmation: ETT inserted through vocal cords under direct vision,  CO2 detector and Breath sounds checked- equal and bilateral Secured at: 25 cm Tube secured with: ETT holder        CRITICAL  CARE Performed by: Rochele Raring   Total critical care time: 65 minutes  Critical care time was exclusive of separately billable procedures and treating other patients.  Critical care was necessary to treat or prevent imminent or life-threatening deterioration.  Critical care was time spent personally by me on the following activities: development of treatment plan with patient and/or surrogate as well as nursing, discussions with consultants, evaluation of patient's response to treatment, examination of patient, obtaining history from patient or surrogate, ordering and performing treatments and interventions, ordering and review of laboratory studies, ordering and review of radiographic studies, pulse oximetry and re-evaluation of patient's condition.  Christopher Sanders was evaluated in Emergency Department on 09/05/2019 for the symptoms described in the history of present illness. He was evaluated in the context of the global COVID-19 pandemic, which necessitated consideration that the patient might be at risk for infection with the SARS-CoV-2 virus that causes COVID-19. Institutional protocols and algorithms that pertain to the evaluation of patients at risk for COVID-19 are in a state of rapid change based on information released by regulatory bodies including the CDC and federal and state organizations. These policies and algorithms were followed during the patient's care in the ED.  Patient was seen  wearing N95, face shield, gloves.     Teola Felipe, Delice Bison, DO 09/06/19 [phone]

## 2019-09-05 NOTE — ED Triage Notes (Signed)
Pt transported from friends home bathroom floor, pt unresponsive, known ingestion of GHB and heroin. Bystanders gave 1.2mg  Narcan by bystanders then 0.5mg  x 2 by EMS.  HR 30-50, intermittent posturing, moaning, NRB iv x 1 est.

## 2019-09-05 NOTE — ED Notes (Signed)
Pt having episodes of apnea, Dr. Elesa Massed at bedside, decision made to intubate for safety.   2346 Etomidate IVP 2346 Roc IVP 2352 pt intubated on first pass by Dr. Elesa Massed, 7.5 ett, 25 at the lip.  # 18 OG placed by Inetta Fermo, RN

## 2019-09-05 NOTE — ED Notes (Signed)
2nd IV est by Inetta Fermo RN, Bare hugger in place.

## 2019-09-05 NOTE — ED Notes (Signed)
Port chest  done 

## 2019-09-05 NOTE — Progress Notes (Signed)
ABG done per Dr. Elesa Massed.

## 2019-09-06 ENCOUNTER — Emergency Department (HOSPITAL_COMMUNITY): Payer: Self-pay

## 2019-09-06 DIAGNOSIS — Z01818 Encounter for other preprocedural examination: Secondary | ICD-10-CM | POA: Insufficient documentation

## 2019-09-06 DIAGNOSIS — T50901A Poisoning by unspecified drugs, medicaments and biological substances, accidental (unintentional), initial encounter: Secondary | ICD-10-CM | POA: Diagnosis present

## 2019-09-06 DIAGNOSIS — F1910 Other psychoactive substance abuse, uncomplicated: Secondary | ICD-10-CM | POA: Insufficient documentation

## 2019-09-06 DIAGNOSIS — R4182 Altered mental status, unspecified: Secondary | ICD-10-CM

## 2019-09-06 DIAGNOSIS — E876 Hypokalemia: Secondary | ICD-10-CM | POA: Insufficient documentation

## 2019-09-06 LAB — POCT I-STAT 7, (LYTES, BLD GAS, ICA,H+H)
Acid-Base Excess: 2 mmol/L (ref 0.0–2.0)
Acid-Base Excess: 3 mmol/L — ABNORMAL HIGH (ref 0.0–2.0)
Bicarbonate: 26 mmol/L (ref 20.0–28.0)
Bicarbonate: 27.1 mmol/L (ref 20.0–28.0)
Calcium, Ion: 1.17 mmol/L (ref 1.15–1.40)
Calcium, Ion: 1.21 mmol/L (ref 1.15–1.40)
HCT: 38 % — ABNORMAL LOW (ref 39.0–52.0)
HCT: 39 % (ref 39.0–52.0)
Hemoglobin: 12.9 g/dL — ABNORMAL LOW (ref 13.0–17.0)
Hemoglobin: 13.3 g/dL (ref 13.0–17.0)
O2 Saturation: 100 %
O2 Saturation: 99 %
Patient temperature: 95.5
Patient temperature: 97
Potassium: 3.2 mmol/L — ABNORMAL LOW (ref 3.5–5.1)
Potassium: 3.3 mmol/L — ABNORMAL LOW (ref 3.5–5.1)
Sodium: 138 mmol/L (ref 135–145)
Sodium: 139 mmol/L (ref 135–145)
TCO2: 27 mmol/L (ref 22–32)
TCO2: 28 mmol/L (ref 22–32)
pCO2 arterial: 32.2 mmHg (ref 32.0–48.0)
pCO2 arterial: 38.5 mmHg (ref 32.0–48.0)
pH, Arterial: 7.447 (ref 7.350–7.450)
pH, Arterial: 7.512 — ABNORMAL HIGH (ref 7.350–7.450)
pO2, Arterial: 105 mmHg (ref 83.0–108.0)
pO2, Arterial: 158 mmHg — ABNORMAL HIGH (ref 83.0–108.0)

## 2019-09-06 LAB — RAPID URINE DRUG SCREEN, HOSP PERFORMED
Amphetamines: POSITIVE — AB
Barbiturates: NOT DETECTED
Benzodiazepines: NOT DETECTED
Cocaine: NOT DETECTED
Opiates: POSITIVE — AB
Tetrahydrocannabinol: NOT DETECTED

## 2019-09-06 LAB — BASIC METABOLIC PANEL
Anion gap: 13 (ref 5–15)
BUN: 12 mg/dL (ref 6–20)
CO2: 23 mmol/L (ref 22–32)
Calcium: 8.8 mg/dL — ABNORMAL LOW (ref 8.9–10.3)
Chloride: 103 mmol/L (ref 98–111)
Creatinine, Ser: 0.86 mg/dL (ref 0.61–1.24)
GFR calc Af Amer: >60 mL/min (ref >60)
GFR calc non Af Amer: >60 mL/min (ref >60)
Glucose, Bld: 118 mg/dL — ABNORMAL HIGH (ref 70–99)
Potassium: 3.3 mmol/L — ABNORMAL LOW (ref 3.5–5.1)
Sodium: 139 mmol/L (ref 135–145)

## 2019-09-06 LAB — COMPREHENSIVE METABOLIC PANEL
ALT: 23 U/L (ref 0–44)
AST: 32 U/L (ref 15–41)
Albumin: 4 g/dL (ref 3.5–5.0)
Alkaline Phosphatase: 63 U/L (ref 38–126)
Anion gap: 15 (ref 5–15)
BUN: 13 mg/dL (ref 6–20)
CO2: 24 mmol/L (ref 22–32)
Calcium: 9.3 mg/dL (ref 8.9–10.3)
Chloride: 100 mmol/L (ref 98–111)
Creatinine, Ser: 0.94 mg/dL (ref 0.61–1.24)
GFR calc Af Amer: >60 mL/min (ref >60)
GFR calc non Af Amer: >60 mL/min (ref >60)
Glucose, Bld: 108 mg/dL — ABNORMAL HIGH (ref 70–99)
Potassium: 3.8 mmol/L (ref 3.5–5.1)
Sodium: 139 mmol/L (ref 135–145)
Total Bilirubin: 1.3 mg/dL — ABNORMAL HIGH (ref 0.3–1.2)
Total Protein: 6.6 g/dL (ref 6.5–8.1)

## 2019-09-06 LAB — CBC
HCT: 41.1 % (ref 39.0–52.0)
Hemoglobin: 13.7 g/dL (ref 13.0–17.0)
MCH: 30 pg (ref 26.0–34.0)
MCHC: 33.3 g/dL (ref 30.0–36.0)
MCV: 90.1 fL (ref 80.0–100.0)
Platelets: 347 10*3/uL (ref 150–400)
RBC: 4.56 MIL/uL (ref 4.22–5.81)
RDW: 12.5 % (ref 11.5–15.5)
WBC: 10 10*3/uL (ref 4.0–10.5)
nRBC: 0 % (ref 0.0–0.2)

## 2019-09-06 LAB — HIV ANTIBODY (ROUTINE TESTING W REFLEX): HIV Screen 4th Generation wRfx: NONREACTIVE — AB

## 2019-09-06 LAB — URINALYSIS, ROUTINE W REFLEX MICROSCOPIC
Bilirubin Urine: NEGATIVE
Glucose, UA: NEGATIVE mg/dL
Hgb urine dipstick: NEGATIVE
Ketones, ur: 20 mg/dL — AB
Leukocytes,Ua: NEGATIVE
Nitrite: NEGATIVE
Protein, ur: NEGATIVE mg/dL
Specific Gravity, Urine: 1.014 (ref 1.005–1.030)
pH: 6 (ref 5.0–8.0)

## 2019-09-06 LAB — URINE CULTURE: Culture: NO GROWTH

## 2019-09-06 LAB — MRSA PCR SCREENING: MRSA by PCR: NEGATIVE

## 2019-09-06 LAB — ACETAMINOPHEN LEVEL: Acetaminophen (Tylenol), Serum: <10 ug/mL — ABNORMAL LOW (ref 10–30)

## 2019-09-06 LAB — SALICYLATE LEVEL: Salicylate Lvl: <7 mg/dL — ABNORMAL LOW (ref 7.0–30.0)

## 2019-09-06 LAB — PHOSPHORUS: Phosphorus: 3.3 mg/dL (ref 2.5–4.6)

## 2019-09-06 LAB — MAGNESIUM: Magnesium: 2.1 mg/dL (ref 1.7–2.4)

## 2019-09-06 LAB — RESPIRATORY PANEL BY RT PCR (FLU A&B, COVID)
Influenza A by PCR: NEGATIVE
Influenza B by PCR: NEGATIVE
SARS Coronavirus 2 by RT PCR: NEGATIVE

## 2019-09-06 LAB — LACTIC ACID, PLASMA: Lactic Acid, Venous: 0.8 mmol/L (ref 0.5–1.9)

## 2019-09-06 LAB — ETHANOL: Alcohol, Ethyl (B): <10 mg/dL (ref <10)

## 2019-09-06 MED ORDER — MIDAZOLAM HCL 2 MG/2ML IJ SOLN
INTRAMUSCULAR | Status: AC
  Administered 2019-09-06: 02:00:00 2 mg via INTRAVENOUS
  Filled 2019-09-06: qty 2

## 2019-09-06 MED ORDER — PROPOFOL 1000 MG/100ML IV EMUL
5.0000 ug/kg/min | INTRAVENOUS | Status: DC
  Administered 2019-09-06: 11:00:00 80 ug/kg/min via INTRAVENOUS
  Administered 2019-09-06: 01:00:00 20 ug/kg/min via INTRAVENOUS
  Administered 2019-09-06: 80 ug/kg/min via INTRAVENOUS
  Filled 2019-09-06 (×4): qty 100

## 2019-09-06 MED ORDER — ONDANSETRON HCL 4 MG/2ML IJ SOLN
4.0000 mg | Freq: Four times a day (QID) | INTRAMUSCULAR | Status: DC | PRN
  Administered 2019-09-06: 4 mg via INTRAVENOUS

## 2019-09-06 MED ORDER — MIDAZOLAM 50MG/50ML (1MG/ML) PREMIX INFUSION
1.0000 mg/h | INTRAVENOUS | Status: DC
  Administered 2019-09-06: 1 mg/h via INTRAVENOUS
  Filled 2019-09-06: qty 50

## 2019-09-06 MED ORDER — HEPARIN SODIUM (PORCINE) 5000 UNIT/ML IJ SOLN
5000.0000 [IU] | Freq: Three times a day (TID) | INTRAMUSCULAR | Status: DC
  Administered 2019-09-06 (×2): 5000 [IU] via SUBCUTANEOUS
  Filled 2019-09-06: qty 1

## 2019-09-06 MED ORDER — CHLORHEXIDINE GLUCONATE CLOTH 2 % EX PADS
6.0000 | MEDICATED_PAD | Freq: Every day | CUTANEOUS | Status: DC
  Administered 2019-09-06: 15:00:00 6 via TOPICAL

## 2019-09-06 MED ORDER — CHLORHEXIDINE GLUCONATE 0.12% ORAL RINSE (MEDLINE KIT)
15.0000 mL | Freq: Two times a day (BID) | OROMUCOSAL | Status: DC
  Administered 2019-09-06: 09:00:00 15 mL via OROMUCOSAL

## 2019-09-06 MED ORDER — PANTOPRAZOLE SODIUM 40 MG IV SOLR: 40.0000 mg | Freq: Every day | INTRAVENOUS | Status: DC

## 2019-09-06 MED ORDER — POTASSIUM CHLORIDE 20 MEQ/15ML (10%) PO SOLN
40.0000 meq | Freq: Once | ORAL | Status: AC
  Administered 2019-09-06: 40 meq
  Filled 2019-09-06: qty 30

## 2019-09-06 MED ORDER — ORAL CARE MOUTH RINSE
15.0000 mL | OROMUCOSAL | Status: DC
  Administered 2019-09-06: 11:00:00 15 mL via OROMUCOSAL

## 2019-09-06 MED ORDER — MIDAZOLAM HCL 2 MG/2ML IJ SOLN: 2.0000 mg | Freq: Once | INTRAMUSCULAR | Status: AC

## 2019-09-06 NOTE — Progress Notes (Signed)
Patient belongings retrieved from Security.  Given to patient.  Patient inspected bag.  Will call someone to come and pick him up.

## 2019-09-06 NOTE — Progress Notes (Signed)
NAME:  Christopher Sanders, MRN:  [id number], DOB:  1976/03/09, LOS: 0 ADMISSION DATE:  09/05/2019, CONSULTATION DATE:  09/06/19 REFERRING MD:  Ward  CHIEF COMPLAINT:  AMS, drug overdose   Brief History   Christopher Sanders is a 44 y.o. male with a h/o of polysubstance abuse who was admitted 2/2 after drug overdose (known GHB and heroin use).  Urine drug screen positive for opiates and amphetamines.  Required intubation in ED.  Patient is now extubated.  Past Medical History  Polysubstance abuse.  Significant Hospital Events   2/2 > admit  Consults:  None.  Procedures:  ETT 2/1 > 2/2  Significant Diagnostic Tests:  CT head and C-spine 2/1 > neg.  Micro Data:  Blood 2/2 > pending Flu 2/1 > neg. COVID 2/1 > neg. HIV > in process UA > ketones present, no sign of infection  Antimicrobials:  None.   Interim history/subjective:  Patient vomited during extubation.  He was very agitated.  Objective:  Blood pressure 134/86, pulse 88, temperature (!) 96.8 F (36 C), resp. rate (!) 23, height 6\' 1"  (1.854 m), weight 88.9 kg, SpO2 100 %.    Vent Mode: PSV;CPAP FiO2 (%):  [40 %-60 %] 40 % Set Rate:  [16 bmp] 16 bmp Vt Set:  [630 mL] 630 mL PEEP:  [5 cmH20] 5 cmH20 Pressure Support:  [5 cmH20] 5 cmH20 Plateau Pressure:  [16 cmH20-20 cmH20] 20 cmH20   Intake/Output Summary (Last 24 hours) at 09/06/2019 1334 Last data filed at 09/06/2019 1300 Gross per 24 hour  Intake 1864.39 ml  Output 670 ml  Net 1194.39 ml   Filed Weights   09/05/19 2329 09/06/19 0445  Weight: 82 kg 88.9 kg    Examination: (examined prior to extubation) General: Young adult male, in NAD. Neuro: Heavily sedated, not responsive, pupils small but responsive. HEENT: [place]/AT. Sclerae anicteric.  ETT in place.  Cervical collar in place. Cardiovascular: RRR, no M/R/G.  Lungs: CTA bilaterally, No wheezes or crackles  Abdomen: soft, non-distended, minimal BS  Musculoskeletal: No gross deformities, no edema.  Skin: Intact, warm, no  rashes.  Assessment & Plan:   Drug overdose with long standing hx polysubstance abuse - per report, known ingestion of GHB and heroin (no response to narcan by bystanders and EMS).  UDS pending.  EtOH, APAP, salicylates all neg. - Polysubstance abuse counseling.  Respiratory insufficiency with inability to protect the airway - 2/2 above.  Patient was extubated 2/2.    Acute encephalopathy - 2/2 above. - Continue supportive care.  Hypokalemia - 40 mEq K per tube this AM - Follow BMP   Best Practice:  Diet: NPO. Pain/Anxiety/Delirium protocol (if indicated): Propofol gtt / Midazolam gtt.  RASS goal -1. VAP protocol (if indicated): In place. DVT prophylaxis: SCD's / Heparin. GI prophylaxis: PPI. Glucose control: None. Mobility: Bedrest. Code Status: Full. Family Communication: Will call brother. Disposition: ICU.  Labs   CBC: Recent Labs  Lab 09/05/19 2324 09/05/19 2325 09/06/19 0026 09/06/19 0349 09/06/19 0438  WBC 12.3*  --   --   --  10.0  HGB 14.4 12.6* 12.9* 13.3 13.7  HCT 43.5 37.0* 38.0* 39.0 41.1  MCV 89.7  --   --   --  90.1  PLT 337  --   --   --  347   Basic Metabolic Panel: Recent Labs  Lab 09/05/19 2324 09/05/19 2325 09/06/19 0026 09/06/19 0349 09/06/19 0438  NA 139 133* 138 139 139  K 3.8 3.5 3.2* 3.3*  3.3*  CL 100  --   --   --  103  CO2 24  --   --   --  23  GLUCOSE 108*  --   --   --  118*  BUN 13  --   --   --  12  CREATININE 0.94  --   --   --  0.86  CALCIUM 9.3  --   --   --  8.8*  MG  --   --   --   --  2.1  PHOS  --   --   --   --  3.3   GFR: Estimated Creatinine Clearance: 127.7 mL/min (by C-G formula based on SCr of 0.86 mg/dL). Recent Labs  Lab 09/05/19 2324 09/05/19 2335 09/06/19 0438  WBC 12.3*  --  10.0  LATICACIDVEN  --  0.8  --    Liver Function Tests: Recent Labs  Lab 09/05/19 2324  AST 32  ALT 23  ALKPHOS 63  BILITOT 1.3*  PROT 6.6  ALBUMIN 4.0   No results for input(s): LIPASE, AMYLASE in the last 168  hours. No results for input(s): AMMONIA in the last 168 hours. ABG    Component Value Date/Time   PHART 7.512 (H) 09/06/2019 0349   PCO2ART 32.2 09/06/2019 0349   PO2ART 158.0 (H) 09/06/2019 0349   HCO3 26.0 09/06/2019 0349   TCO2 27 09/06/2019 0349   O2SAT 100.0 09/06/2019 0349    Coagulation Profile: No results for input(s): INR, PROTIME in the last 168 hours. Cardiac Enzymes: No results for input(s): CKTOTAL, CKMB, CKMBINDEX, TROPONINI in the last 168 hours. HbA1C: No results found for: HGBA1C CBG: Recent Labs  Lab 09/05/19 2325  GLUCAP 112*    Past medical history  He,  has a past medical history of Medical history non-contributory and Polysubstance abuse ([facility]).   Surgical History   History reviewed. No pertinent surgical history.   Social History   reports that he has been smoking cigarettes. He has been smoking about 1.00 pack per day. His smokeless tobacco use includes snuff. He reports current drug use. He reports that he does not drink alcohol.   Family history   His family history is not on file.   Allergies No Known Allergies   Home meds  Prior to Admission medications   Not on File    Critical care time: 45 min.    Mallie Darting, MS4

## 2019-09-06 NOTE — Progress Notes (Signed)
Patient admitted from ED to 2H26 via stretcher. Patient connected to 2H monitor and bedside report received from Autumn, Charity fundraiser. Patient intubated, sedated, and responsive to touch. VSS. Please see flowsheets for additional information.   MRSA swab collected. CHG bath performed. Will continue to monitor.

## 2019-09-06 NOTE — H&P (Signed)
NAME:  Christopher Sanders, MRN:  [id number], DOB:  1976-01-29, LOS: 0 ADMISSION DATE:  09/05/2019, CONSULTATION DATE:  09/06/19 REFERRING MD:  Ward  CHIEF COMPLAINT:  AMS, drug overdose   Brief History   Christopher Sanders is a 44 y.o. male who was admitted 2/2 after drug overdose (known GHB and heroin use).  Required intubation in ED  History of present illness   Pt is encephelopathic; therefore, this HPI is obtained from chart review. Christopher Sanders is a 44 y.o. male who has a PMH of polysubstance abuse.  He presented to [facility] ED 2/1 after being found unresponsive with friends following drug overdose.  Has long hx of polysubstance abuse.  Just had admission 07/18/19 through 07/19/19 for drug overdose (UDS pos for amphetamines and benzo's).  He eloped / left AMA 07/19/19.  2/1, he was at friends home and was found on bathroom floor after known ingestion of GHB and heroin.  Bystanders administered 1.2mg  narcan without improvement.  EMS called and administered 2 more doses of 0.5mg  narcan with minimal improvement.  On arrival to ED, he had intermittent apnea and was subsequently intubated. Head CT was negative.  UDS pending.  PCCM asked to admit.   Past Medical History  Polysubstance abuse.  Significant Hospital Events   2/2 > admit.  Consults:  None.  Procedures:  ETT 2/1 >   Significant Diagnostic Tests:  CT head and C-spine 2/1 > neg.  Micro Data:  Blood 2/2 >  Sputum 2/2 >  Flu 2/1 > neg. COVID 2/1 > neg.  Antimicrobials:  None.   Interim history/subjective:  Heavily sedated as became agitated earlier in ED.  Objective:  Blood pressure (!) 133/93, pulse 90, temperature (!) 95.6 F (35.3 C), resp. rate 16, weight 82 kg, SpO2 97 %.    Vent Mode: PRVC FiO2 (%):  [60 %] 60 % Set Rate:  [16 bmp] 16 bmp Vt Set:  [630 mL] 630 mL PEEP:  [5 cmH20] 5 cmH20 Plateau Pressure:  [16 cmH20] 16 cmH20   Intake/Output Summary (Last 24 hours) at 09/06/2019 0220 Last data filed at 09/06/2019 0125 Gross  per 24 hour  Intake -  Output 70 ml  Net -70 ml   Filed Weights   09/05/19 2329  Weight: 82 kg    Examination: General: Young adult male, in NAD. Neuro: Heavily sedated, not responsive. HEENT: [place]/AT. Sclerae anicteric.  ETT in place.  Cervical collar in place. Cardiovascular: RRR, no M/R/G.  Lungs: Respirations even and unlabored.  CTA bilaterally, No W/R/R.  Abdomen: BS x 4, soft, NT/ND.  Musculoskeletal: No gross deformities, no edema.  Skin: Intact, warm, no rashes.  Assessment & Plan:   Drug overdose with long standing hx polysubstance abuse - per report, known ingestion of GHB and heroin (no response to narcan by bystanders and EMS).  UDS pending.  EtOH, APAP, salicylates all neg. - Polysubstance abuse counseling.  Respiratory insufficiency with inability to protect the airway - 2/2 above. - Full vent support. - No SBT until mental status improves. - Bronchial hygiene. - Assess sputum culture for completeness. - Follow CXR.  Acute encephalopathy - 2/2 above. - Continue supportive care.  Hypokalemia. - 40 mEq K per tube. - Follow BMP.   Best Practice:  Diet: NPO. Pain/Anxiety/Delirium protocol (if indicated): Propofol gtt / Midazolam gtt.  RASS goal -1. VAP protocol (if indicated): In place. DVT prophylaxis: SCD's / Heparin. GI prophylaxis: PPI. Glucose control: None. Mobility: Bedrest. Code Status: Full. Family Communication: Will call  brother. Disposition: ICU.  Labs   CBC: Recent Labs  Lab 09/05/19 2324 09/05/19 2325 09/06/19 0026  WBC 12.3*  --   --   HGB 14.4 12.6* 12.9*  HCT 43.5 37.0* 38.0*  MCV 89.7  --   --   PLT 337  --   --    Basic Metabolic Panel: Recent Labs  Lab 09/05/19 2324 09/05/19 2325 09/06/19 0026  NA 139 133* 138  K 3.8 3.5 3.2*  CL 100  --   --   CO2 24  --   --   GLUCOSE 108*  --   --   BUN 13  --   --   CREATININE 0.94  --   --   CALCIUM 9.3  --   --    GFR: Estimated Creatinine Clearance: 116.9 mL/min (by  C-G formula based on SCr of 0.94 mg/dL). Recent Labs  Lab 09/05/19 2324 09/05/19 2335  WBC 12.3*  --   LATICACIDVEN  --  0.8   Liver Function Tests: Recent Labs  Lab 09/05/19 2324  AST 32  ALT 23  ALKPHOS 63  BILITOT 1.3*  PROT 6.6  ALBUMIN 4.0   No results for input(s): LIPASE, AMYLASE in the last 168 hours. No results for input(s): AMMONIA in the last 168 hours. ABG    Component Value Date/Time   PHART 7.447 09/06/2019 0026   PCO2ART 38.5 09/06/2019 0026   PO2ART 105.0 09/06/2019 0026   HCO3 27.1 09/06/2019 0026   TCO2 28 09/06/2019 0026   O2SAT 99.0 09/06/2019 0026    Coagulation Profile: No results for input(s): INR, PROTIME in the last 168 hours. Cardiac Enzymes: No results for input(s): CKTOTAL, CKMB, CKMBINDEX, TROPONINI in the last 168 hours. HbA1C: No results found for: HGBA1C CBG: Recent Labs  Lab 09/05/19 2325  GLUCAP 112*    Review of Systems:   Unable to obtain as pt is encephalopathic.  Past medical history  He,  has a past medical history of Medical history non-contributory and Polysubstance abuse ([place]).   Surgical History   History reviewed. No pertinent surgical history.   Social History   reports that he has been smoking cigarettes. He has been smoking about 1.00 pack per day. His smokeless tobacco use includes snuff. He reports current drug use. He reports that he does not drink alcohol.   Family history   His family history is not on file.   Allergies No Known Allergies   Home meds  Prior to Admission medications   Not on File    Critical care time: 45 min.    Montey Hora, [facility] 09/06/2019, 2:20 AM

## 2019-09-06 NOTE — Progress Notes (Signed)
Pt became increasingly agitated and demanded to leave; NT and RN explained need and importance of staying overnight for continued monitoring following overdose. Pt continued becoming increasingly agitated stating that he wanted to leave AMA. RN attempted to de-escalate the situation. MD was called to bedside. Pt was adamant he wanted to leave despite MD explaining need for monitoring overnight. The AMA form was signed. PIV x 2 removed. Pt belongings returned from room and from safe all accounted for. VSS. Pt on room air. Pt walked out of facility independently.

## 2019-09-06 NOTE — ED Notes (Signed)
Patient brother returning our call  Please call back Ladona Ridgel  [phone]

## 2019-09-06 NOTE — ED Notes (Signed)
[phone] brother taylor would like an update.

## 2019-09-06 NOTE — Procedures (Signed)
Extubation Procedure Note  Patient Details:   Name: Christopher Sanders DOB: [birth year] MRN: [id number]   Airway Documentation:    Vent end date: 09/06/19 Vent end time: 1148   Evaluation  O2 sats: stable throughout Complications: No apparent complications Patient did tolerate procedure well. Bilateral Breath Sounds: Rhonchi   Pt extubated per MD order.  +cuff leak  Placed on 4L [place] tolerating well, no distress noted at this time  Pt has strong cough and able to voice  Cherylin Mylar 09/06/2019, 11:53 AM

## 2019-09-06 NOTE — ED Notes (Signed)
Pt suddenly became very restless, thrashing arms and legs, coughing. Dr. Elesa Massed notified, Versed given, Propofol increased

## 2019-09-11 LAB — CULTURE, BLOOD (ROUTINE X 2)
Culture: NO GROWTH
Culture: NO GROWTH
Special Requests: ADEQUATE

## 2019-10-24 ENCOUNTER — Encounter (HOSPITAL_COMMUNITY): Payer: Self-pay | Admitting: Emergency Medicine

## 2019-10-24 ENCOUNTER — Emergency Department (HOSPITAL_COMMUNITY)
Admission: EM | Admit: 2019-10-24 | Discharge: 2019-10-24 | Disposition: A | Discharge status: Home / Self Care | Payer: Self-pay | Attending: Emergency Medicine | Admitting: Emergency Medicine

## 2019-10-24 DIAGNOSIS — Y9389 Activity, other specified: Secondary | ICD-10-CM | POA: Insufficient documentation

## 2019-10-24 DIAGNOSIS — S8391XA Sprain of unspecified site of right knee, initial encounter: Secondary | ICD-10-CM | POA: Insufficient documentation

## 2019-10-24 DIAGNOSIS — Y999 Unspecified external cause status: Secondary | ICD-10-CM | POA: Insufficient documentation

## 2019-10-24 DIAGNOSIS — F17210 Nicotine dependence, cigarettes, uncomplicated: Secondary | ICD-10-CM | POA: Insufficient documentation

## 2019-10-24 DIAGNOSIS — X509XXA Other and unspecified overexertion or strenuous movements or postures, initial encounter: Secondary | ICD-10-CM | POA: Insufficient documentation

## 2019-10-24 DIAGNOSIS — Y9289 Other specified places as the place of occurrence of the external cause: Secondary | ICD-10-CM | POA: Insufficient documentation

## 2019-10-24 MED ORDER — IBUPROFEN 200 MG PO TABS
600.0000 mg | ORAL_TABLET | Freq: Once | ORAL | Status: AC
  Administered 2019-10-24: 13:00:00 600 mg via ORAL
  Filled 2019-10-24: qty 3

## 2019-10-24 MED ORDER — ACETAMINOPHEN 325 MG PO TABS
650.0000 mg | ORAL_TABLET | Freq: Once | ORAL | Status: AC
  Administered 2019-10-24: 650 mg via ORAL
  Filled 2019-10-24: qty 2

## 2019-10-24 NOTE — ED Provider Notes (Signed)
[place] DEPT Provider Note   CSN: [id number] Arrival date & time: 10/24/19  1202     History Chief Complaint  Patient presents with  . Medical Clearance  . Knee Pain    Christopher Sanders is a 44 y.o. male with a past medical history of substance abuse presenting to the ED with [employer] for concerns for right knee pain.  1 month ago states that he twisted his right knee and has been having pain since then.  Patient unclear about history and unwilling to disclose more information to me. I am unsure if he has been taking any medications to help with his pain.  He denies any direct trauma, falls or history of DVT.  He is denying any SI, HI, AVH.  HPI     Past Medical History:  Diagnosis Date  . Medical history non-contributory   . Polysubstance abuse [facility])     Patient Active Problem List   Diagnosis Date Noted  . Drug overdose 09/06/2019  . Encounter for intubation   . Hypokalemia   . Polysubstance abuse ([place])   . Acute encephalopathy 07/18/2019    History reviewed. No pertinent surgical history.     No family history on file.  Social History   Tobacco Use  . Smoking status: Current Every Day Smoker    Packs/day: 1.00    Types: Cigarettes  . Smokeless tobacco: Current User    Types: Snuff  Substance Use Topics  . Alcohol use: No  . Drug use: Yes    Comment: ghb/heroin    Home Medications Prior to Admission medications   Not on File    Allergies    Patient has no known allergies.  Review of Systems   Review of Systems  Constitutional: Negative for appetite change, chills and fever.  HENT: Negative for ear pain, rhinorrhea, sneezing and sore throat.   Eyes: Negative for photophobia and visual disturbance.  Respiratory: Negative for cough, chest tightness, shortness of breath and wheezing.   Cardiovascular: Negative for chest pain and palpitations.  Gastrointestinal: Negative for abdominal pain, blood in stool, constipation,  diarrhea, nausea and vomiting.  Genitourinary: Negative for dysuria, hematuria and urgency.  Musculoskeletal: Positive for arthralgias. Negative for myalgias.  Skin: Negative for rash.  Neurological: Negative for dizziness, weakness and light-headedness.    Physical Exam Updated Vital Signs BP (!) 146/99 (BP Location: Right Arm)   Pulse 88   Temp 98 F (36.7 C) (Oral)   Resp 19   SpO2 99%   Physical Exam Vitals and nursing note reviewed.  Constitutional:      General: He is not in acute distress.    Appearance: He is well-developed.  HENT:     Head: Normocephalic and atraumatic.     Nose: Nose normal.  Eyes:     General: No scleral icterus.       Left eye: No discharge.     Conjunctiva/sclera: Conjunctivae normal.  Cardiovascular:     Rate and Rhythm: Normal rate and regular rhythm.     Heart sounds: Normal heart sounds. No murmur. No friction rub. No gallop.   Pulmonary:     Effort: Pulmonary effort is normal. No respiratory distress.     Breath sounds: Normal breath sounds.  Abdominal:     General: Bowel sounds are normal. There is no distension.     Palpations: Abdomen is soft.     Tenderness: There is no abdominal tenderness. There is no guarding.  Musculoskeletal:  General: Swelling and tenderness present. Normal range of motion.     Cervical back: Normal range of motion and neck supple.     Comments: Tenderness to palpation and some edema noted of the right knee without changes to range of motion, overlying erythema or warmth of joint.  2+ DP pulse palpated bilaterally.  Ambulatory without difficulty. No calf or ankle tenderness.  Skin:    General: Skin is warm and dry.     Findings: No rash.  Neurological:     Mental Status: He is alert.     Motor: No abnormal muscle tone.     Coordination: Coordination normal.     ED Results / Procedures / Treatments   Labs (all labs ordered are listed, but only abnormal results are displayed) Labs Reviewed - No  data to display  EKG None  Radiology No results found.  Procedures Procedures (including critical care time)  Medications Ordered in ED Medications  ibuprofen (ADVIL) tablet 600 mg (600 mg Oral Given 10/24/19 1301)  acetaminophen (TYLENOL) tablet 650 mg (650 mg Oral Given 10/24/19 1301)    ED Course  I have reviewed the triage vital signs and the nursing notes.  Pertinent labs & imaging results that were available during my care of the patient were reviewed by me and considered in my medical decision making (see chart for details).    MDM Rules/Calculators/A&P                      44 year old male presents to ED in [employer] custody after stealing a police car.  He was brought to the ER for concerns for his right knee pain for the past month.  He tells me that he twisted his knee about a month ago and started having pain after that.  He is unwilling to disclose more information and appears distracted by what the officers are talking about. He does appear under the influence but is speaking without difficulty and is ambulatory.  Suspect that his knee pain could be due to a sprain. Doubt infectious or vascular etiology. Will give him a knee sleeve, advised anti-inflammatories and PCP follow-up.  Patient is hemodynamically stable, in NAD, and able to ambulate in the ED. Evaluation does not show pathology that would require ongoing emergent intervention or inpatient treatment. I have personally reviewed and interpreted all lab work and imaging at today's ED visit. I explained the diagnosis to the patient. Pain has been managed and has no complaints prior to discharge. Patient is comfortable with above plan and is stable for discharge at this time. All questions were answered prior to disposition. Strict return precautions for returning to the ED were discussed. Encouraged follow up with PCP.   An After Visit Summary was printed and given to the patient.   Portions of this note were generated  with Scientist, clinical (histocompatibility and immunogenetics). Dictation errors may occur despite best attempts at proofreading.  Final Clinical Impression(s) / ED Diagnoses Final diagnoses:  Sprain of right knee, unspecified ligament, initial encounter    Rx / DC Orders ED Discharge Orders    None       Dietrich Pates, PA-C 10/24/19 1308    Tegeler, Canary Brim, MD 10/24/19 1335

## 2019-10-24 NOTE — ED Notes (Signed)
Patient states that he does not need to be here, states "I just wanted to joy ride".

## 2019-10-24 NOTE — Discharge Instructions (Addendum)
Take Tylenol and ibuprofen as needed for pain. Wear the knee sleeve to help with swelling. Using ice and elevating your leg will also help. Return to the ER if you start to have more swelling, additional injuries, numbness in your arms or legs.

## 2019-10-24 NOTE — ED Triage Notes (Addendum)
Patient here via [employer] after stealing a police car and driving down wrong side of road. Officers report that due to patient's history they brought him here. Patient reports that he was entering a contest on a radio station to win. Denies SI/HI.

## 2020-02-13 ENCOUNTER — Encounter (HOSPITAL_COMMUNITY): Payer: Self-pay | Admitting: Student

## 2020-02-13 ENCOUNTER — Emergency Department (HOSPITAL_COMMUNITY)
Admission: EM | Admit: 2020-02-13 | Discharge: 2020-02-13 | Disposition: A | Discharge status: Home / Self Care | Payer: Self-pay | Attending: Emergency Medicine | Admitting: Emergency Medicine

## 2020-02-13 DIAGNOSIS — W010XXA Fall on same level from slipping, tripping and stumbling without subsequent striking against object, initial encounter: Secondary | ICD-10-CM | POA: Insufficient documentation

## 2020-02-13 DIAGNOSIS — Y9289 Other specified places as the place of occurrence of the external cause: Secondary | ICD-10-CM | POA: Insufficient documentation

## 2020-02-13 DIAGNOSIS — W19XXXA Unspecified fall, initial encounter: Secondary | ICD-10-CM

## 2020-02-13 DIAGNOSIS — Y998 Other external cause status: Secondary | ICD-10-CM | POA: Insufficient documentation

## 2020-02-13 DIAGNOSIS — S0990XA Unspecified injury of head, initial encounter: Secondary | ICD-10-CM | POA: Insufficient documentation

## 2020-02-13 DIAGNOSIS — F17210 Nicotine dependence, cigarettes, uncomplicated: Secondary | ICD-10-CM | POA: Insufficient documentation

## 2020-02-13 DIAGNOSIS — S0101XA Laceration without foreign body of scalp, initial encounter: Secondary | ICD-10-CM | POA: Insufficient documentation

## 2020-02-13 DIAGNOSIS — Y9301 Activity, walking, marching and hiking: Secondary | ICD-10-CM | POA: Insufficient documentation

## 2020-02-13 MED ORDER — LIDOCAINE-EPINEPHRINE 2 %-1:100000 IJ SOLN
10.0000 mL | Freq: Once | INTRAMUSCULAR | Status: AC
  Administered 2020-02-13: 10 mL

## 2020-02-13 NOTE — ED Provider Notes (Signed)
[facility]-EMERGENCY DEPT Provider Note   CSN: [id number] Arrival date & time: 02/13/20  0277     History Chief Complaint  Patient presents with  . Head Injury    Christopher Sanders is a 44 y.o. male with a history of polysubstance abuse who presents to the ED in police custody for evaluation of head injury status post mechanical fall @ 0200 this AM.  Patient states he was walking when he slipped on a wet surface and fell backwards striking the back of his head.  This resulted in a laceration which is mildly uncomfortable.  No alleviating or aggravating factors.  He denies any other areas of injury.  Denies visual disturbance, numbness, weakness, vomiting, syncope, seizure activity, neck pain, back pain, chest pain, or abdominal pain.  Last tetanus was within the past 5 years.  He is not on any blood thinners.  HPI     Past Medical History:  Diagnosis Date  . Medical history non-contributory   . Polysubstance abuse [facility])     Patient Active Problem List   Diagnosis Date Noted  . Drug overdose 09/06/2019  . Encounter for intubation   . Hypokalemia   . Polysubstance abuse ([facility])   . Acute encephalopathy 07/18/2019    No past surgical history on file.     No family history on file.  Social History   Tobacco Use  . Smoking status: Current Every Day Smoker    Packs/day: 1.00    Types: Cigarettes  . Smokeless tobacco: Current User    Types: Snuff  Substance Use Topics  . Alcohol use: No  . Drug use: Yes    Comment: ghb/heroin    Home Medications Prior to Admission medications   Not on File    Allergies    Patient has no known allergies.  Review of Systems   Review of Systems  Constitutional: Negative for chills and fever.  Eyes: Negative for visual disturbance.  Respiratory: Negative for shortness of breath.   Cardiovascular: Negative for chest pain.  Gastrointestinal: Negative for abdominal pain, nausea and vomiting.  Skin: Positive for  wound.  Neurological: Positive for headaches. Negative for seizures, syncope, weakness and numbness.   Physical Exam Updated Vital Signs BP 110/84 (BP Location: Right Arm)   Pulse 96   Temp 99 F (37.2 C) (Oral)   Resp 16   Ht 6\' 1"  (1.854 m)   Wt 81.6 kg   SpO2 100%   BMI 23.75 kg/m   Physical Exam Vitals and nursing note reviewed.  Constitutional:      General: He is not in acute distress.    Appearance: Normal appearance. He is not toxic-appearing.  HENT:     Head: Normocephalic.      Comments: No raccoon eyes or battle sign.    Ears:     Comments: No hemotympanum.    Mouth/Throat:     Pharynx: Oropharynx is clear. Uvula midline.  Eyes:     General: Vision grossly intact. Gaze aligned appropriately.     Extraocular Movements: Extraocular movements intact.     Conjunctiva/sclera: Conjunctivae normal.     Pupils: Pupils are equal, round, and reactive to light.     Comments: No proptosis.   Neck:     Comments: No midline spinal tenderness. Cardiovascular:     Rate and Rhythm: Normal rate and regular rhythm.  Pulmonary:     Effort: Pulmonary effort is normal.     Breath sounds: Normal breath sounds.  Chest:  Chest wall: No tenderness.  Abdominal:     General: There is no distension.     Palpations: Abdomen is soft.     Tenderness: There is no abdominal tenderness. There is no guarding or rebound.  Musculoskeletal:     Cervical back: Normal range of motion and neck supple. No rigidity.     Comments: Upper/lower extremities without focal bony tenderness.  No midline spinal tenderness.  Skin:    General: Skin is warm and dry.  Neurological:     Mental Status: He is alert.     Comments: Alert. Clear speech. No facial droop. CNIII-XII grossly intact. Bilateral upper and lower extremities' sensation grossly intact. 5/5 symmetric strength with grip strength and with plantar and dorsi flexion bilaterally .Marland Kitchen Gait intact.    Psychiatric:        Mood and Affect: Mood  normal.        Behavior: Behavior normal.    ED Results / Procedures / Treatments   Labs (all labs ordered are listed, but only abnormal results are displayed) Labs Reviewed - No data to display  EKG None  Radiology No results found.  Procedures .Marland KitchenLaceration Repair  Date/Time: 02/13/2020 4:07 AM Performed by: Cherly Anderson, PA-C Authorized by: Cherly Anderson, PA-C   Consent:    Consent obtained:  Verbal   Consent given by:  Patient   Risks discussed:  Infection, need for additional repair, nerve damage, poor wound healing, poor cosmetic result, pain, retained foreign body, vascular damage and tendon damage   Alternatives discussed:  No treatment Anesthesia (see MAR for exact dosages):    Anesthesia method:  Local infiltration   Local anesthetic:  Lidocaine 2% WITH epi Laceration details:    Location:  Scalp   Scalp location:  R parietal   Length (cm):  3   Depth (mm):  3 Repair type:    Repair type:  Simple Exploration:    Hemostasis achieved with:  Direct pressure   Wound exploration: wound explored through full range of motion and entire depth of wound probed and visualized     Contaminated: no   Treatment:    Area cleansed with:  Betadine   Amount of cleaning:  Standard   Irrigation solution:  Sterile water   Irrigation method:  Pressure wash Skin repair:    Repair method:  Staples   Number of staples:  5 Approximation:    Approximation:  Close Post-procedure details:    Patient tolerance of procedure:  Tolerated well, no immediate complications   (including critical care time)  Medications Ordered in ED Medications - No data to display  ED Course  I have reviewed the triage vital signs and the nursing notes.  Pertinent labs & imaging results that were available during my care of the patient were reviewed by me and considered in my medical decision making (see chart for details).    MDM Rules/Calculators/A&P                           Patient presents to the ED for evaluation status post mechanical fall with head injury resulting in scalp laceration.  Patient is nontoxic and resting comfortably, vitals within normal limits on my assessment.  Initial tachycardia resolved.  Per [place] head CT rules do not feel that CT imaging is necessary at this time.  Patient is alert, oriented, has no focal neurologic deficits.  Laceration was pressure irrigated and visualized in a bloodless field  without evidence of foreign body.  No galeal involvement.  Repair procedure note above.  Tolerated well.  Tetanus is up-to-date.  No other areas of injury noted.  Patient appears appropriate for discharge home at this time. I discussed treatment plan, need for follow-up, and return precautions with the patient. Provided opportunity for questions, patient confirmed understanding and is in agreement with plan.   Final Clinical Impression(s) / ED Diagnoses Final diagnoses:  Injury of head, initial encounter  Laceration of scalp, initial encounter  Fall, initial encounter    Rx / DC Orders ED Discharge Orders    None       Cherly Anderson, PA-C 02/13/20 0413    Gilda Crease, MD 02/13/20 [phone]

## 2020-02-13 NOTE — ED Triage Notes (Signed)
Pt head struck a tile floor. No LOC

## 2020-02-13 NOTE — Discharge Instructions (Addendum)
You were seen in the emergency department today for a laceration. Your laceration was closed with 5 staples. Please keep this area clean and dry for the next 24 hours, after 24 hours you may get this area wet, but avoid soaking the area. Keep the area covered as best possible especially when in the sun to help in minimizing scarring.   You will need to have the staples removed and the wound rechecked in 7-10 days. Please return to the emergency department, go to an urgent care, or see your primary care provider to have this performed. Return to the ER soon should you start to experience pus type drainage from the wound, redness around the wound, or fevers as this could indicate the area is infected, additionally return for increased headache, sudden change in headache, change in your vision, numbness, weakness, slurred speech, facial droop, seizure activity, vomiting, passing out, or any other worsening symptoms or concerns that you may have.

## 2021-03-04 ENCOUNTER — Ambulatory Visit
Admission: EM | Admit: 2021-03-04 | Discharge: 2021-03-04 | Disposition: A | Discharge status: Home / Self Care | Payer: Self-pay | Attending: Emergency Medicine | Admitting: Emergency Medicine

## 2021-03-04 ENCOUNTER — Other Ambulatory Visit: Payer: Self-pay

## 2021-03-04 DIAGNOSIS — F22 Delusional disorders: Secondary | ICD-10-CM

## 2021-03-04 DIAGNOSIS — T148XXA Other injury of unspecified body region, initial encounter: Secondary | ICD-10-CM

## 2021-03-04 DIAGNOSIS — Z20822 Contact with and (suspected) exposure to covid-19: Secondary | ICD-10-CM

## 2021-03-04 DIAGNOSIS — L03116 Cellulitis of left lower limb: Secondary | ICD-10-CM

## 2021-03-04 MED ORDER — DOXYCYCLINE HYCLATE 100 MG PO CAPS: 100.0000 mg | ORAL_CAPSULE | Freq: Two times a day (BID) | ORAL | 0 refills | Status: AC

## 2021-03-04 MED ORDER — MUPIROCIN 2 % EX OINT
1.0000 | TOPICAL_OINTMENT | Freq: Two times a day (BID) | CUTANEOUS | 0 refills | Status: AC
Start: 2021-03-04 — End: ?

## 2021-03-04 MED ORDER — NYSTATIN 100000 UNIT/GM EX POWD: 1.0000 "application " | Freq: Three times a day (TID) | CUTANEOUS | 0 refills | Status: AC

## 2021-03-04 NOTE — ED Triage Notes (Signed)
Pt presents for COVID testing. Denies sxs.   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   Pt c/o a bump on the left side of the leg x 2.5 weeks. States he tried to drain ate and was not successful. States he is concerned about infection.  Pt c/o stomach pain. States he lost a lot of weight.

## 2021-03-04 NOTE — Discharge Instructions (Addendum)
Please go to [facility] or [facility] behavioral health urgent care Doxycycline to help treat infection to leg Warm compresses Let feet air out and apply nystatin powder 3 times daily Bactroban ointment to any open wounds to help prevent infection Please establish with primary care Please go to emergency room or our behavioral health urgent care if having thoughts of harming self or others

## 2021-03-04 NOTE — ED Provider Notes (Signed)
[facility]    CSN: [id number] Arrival date & time: 03/04/21  1042      History   Chief Complaint Chief Complaint  Patient presents with   covid testing   bump on leg    HPI Christopher Sanders is a 45 y.o. male history of homelessness, polysubstance abuse presenting today for evaluation of a rash on leg.  Reports that he has had an area of redness and swelling to his left lower leg over the past few weeks.  He is concerned about infection.  Attempted I&D at home with a knife without relief of symptoms.  He also does express concern of there possibly being a tracker in his leg.  He does report an occasional pins and needle sensation in his legs.  Has been wearing his boots and reports recently with rain has had a lot of moisture on his feet.  He also expresses concern over possible parasite in his stomach as he will occasionally have discomfort in his abdomen, expresses a lot of concern regarding this.  Denies changes in bowel movements.  He declines thoughts of SI/HI, declines concerns for his safety.  He denies any chest pain or shortness of breath.  Denies any dizziness or lightheadedness.  Does report recent substance use he does report that it was IV, but is unsure exactly what he used.  He also reports recently smoking a substance.  Reports that it was an "pain killer".  HPI  Past Medical History:  Diagnosis Date   Medical history non-contributory    Polysubstance abuse [facility])     Patient Active Problem List   Diagnosis Date Noted   Drug overdose 09/06/2019   Encounter for intubation    Hypokalemia    Polysubstance abuse ([facility])    Acute encephalopathy 07/18/2019    History reviewed. No pertinent surgical history.     Home Medications    Prior to Admission medications   Medication Sig Start Date End Date Taking? Authorizing Provider  doxycycline (VIBRAMYCIN) 100 MG capsule Take 1 capsule (100 mg total) by mouth 2 (two) times daily for 7 days. 03/04/21 03/11/21 Yes  Ania Levay C, PA-C  mupirocin ointment (BACTROBAN) 2 % Apply 1 application topically 2 (two) times daily. 03/04/21  Yes Yailine Ballard C, PA-C  nystatin (MYCOSTATIN/NYSTOP) powder Apply 1 application topically 3 (three) times daily. 03/04/21  Yes Vera Wishart, Junius Creamer, PA-C    Family History Family History  Family history unknown: Yes    Social History Social History   Tobacco Use   Smoking status: Every Day    Packs/day: 1.00    Types: Cigarettes   Smokeless tobacco: Current    Types: Snuff  Substance Use Topics   Alcohol use: No   Drug use: Yes    Comment: ghb/heroin     Allergies   Patient has no known allergies.   Review of Systems Review of Systems  Constitutional:  Negative for activity change, appetite change, chills, fatigue and fever.  HENT:  Negative for congestion, ear pain, rhinorrhea, sinus pressure, sore throat and trouble swallowing.   Eyes:  Negative for discharge, redness, itching and visual disturbance.  Respiratory:  Negative for cough, chest tightness and shortness of breath.   Cardiovascular:  Negative for chest pain and leg swelling.  Gastrointestinal:  Positive for abdominal pain. Negative for diarrhea, nausea and vomiting.  Musculoskeletal:  Negative for arthralgias and myalgias.  Skin:  Positive for color change and wound. Negative for rash.  Neurological:  Negative for  dizziness, syncope, weakness, light-headedness and headaches.    Physical Exam Triage Vital Signs ED Triage Vitals  Enc Vitals Group     BP      Pulse      Resp      Temp      Temp src      SpO2      Weight      Height      Head Circumference      Peak Flow      Pain Score      Pain Loc      Pain Edu?      Excl. in GC?    No data found.  Updated Vital Signs BP (!) 141/83 (BP Location: Left Arm)   Pulse 94   Temp 98.9 F (37.2 C) (Oral)   Resp 17   SpO2 96%   Visual Acuity Right Eye Distance:   Left Eye Distance:   Bilateral Distance:    Right Eye  Near:   Left Eye Near:    Bilateral Near:     Physical Exam Vitals and nursing note reviewed.  Constitutional:      Appearance: He is well-developed.     Comments: No acute distress  HENT:     Head: Normocephalic and atraumatic.     Nose: Nose normal.  Eyes:     Conjunctiva/sclera: Conjunctivae normal.  Cardiovascular:     Rate and Rhythm: Normal rate.     Comments: Murmur noted Pulmonary:     Effort: Pulmonary effort is normal. No respiratory distress.  Abdominal:     General: There is no distension.  Musculoskeletal:        General: Normal range of motion.     Cervical back: Neck supple.  Skin:    General: Skin is warm and dry.     Comments: Diffuse superficial abrasions/scratches noted to distal upper leg and lower legs bilaterally, left anterior medial aspect of left leg with slight area of swelling and fluctuance with faint erythema and scabbed area  Bilateral feet pale moist with diffuse macerated skin around toes and plantar surfaces, foul odor noted  No lesions noted on palms  Neurological:     Mental Status: He is alert and oriented to person, place, and time.     UC Treatments / Results  Labs (all labs ordered are listed, but only abnormal results are displayed) Labs Reviewed  NOVEL CORONAVIRUS, NAA    EKG   Radiology No results found.  Procedures Procedures (including critical care time)  Medications Ordered in UC Medications - No data to display  Initial Impression / Assessment and Plan / UC Course  I have reviewed the triage vital signs and the nursing notes.  Pertinent labs & imaging results that were available during my care of the patient were reviewed by me and considered in my medical decision making (see chart for details).     Paranoid thoughts-denies SI/HI, provided contacts for [facility] and [facility] behavioral health urgent care to follow-up with, did offer screening of stool with stool sample for parasite concerns, but  patient declined.  He would like to have care today, requesting EMS transport to ED, declined this as he does not have any SI/HI as well as no emergent need.  Patient does not seem harm to self or others at this time. But did encourage him to go to ED/Behavioral health due to his paranoid psychosis. Leg wound/infection-superficial abrasions without obvious signs of infection, most concerning  area is area around area of swelling although does not appear suggestive of abscess at this time, placing on doxycycline, bactroban topically for other superficial wounds Macerated feet-encourage patient to take off socks and shoes and let feet air out to avoid further moisture on feet, nystatin powder topically 3 times daily a and monitor for healing of feet Substance abuse-provided contact for follow-up for recovery and behavioral health urgent care Discussed strict return precautions. Patient verbalized understanding and is agreeable with plan.  Final Clinical Impressions(s) / UC Diagnoses   Final diagnoses:  Cellulitis of left lower leg  Superficial abrasion  Paranoia ([facility])  Encounter for screening laboratory testing for COVID-19 virus in asymptomatic patient     Discharge Instructions      Please go to [facility] or [facility] behavioral health urgent care Doxycycline to help treat infection to leg Warm compresses Let feet air out and apply nystatin powder 3 times daily Bactroban ointment to any open wounds to help prevent infection Please establish with primary care Please go to emergency room or our behavioral health urgent care if having thoughts of harming self or others     ED Prescriptions     Medication Sig Dispense Auth. Provider   doxycycline (VIBRAMYCIN) 100 MG capsule Take 1 capsule (100 mg total) by mouth 2 (two) times daily for 7 days. 14 capsule Jeanifer Halliday C, PA-C   mupirocin ointment (BACTROBAN) 2 % Apply 1 application topically 2 (two) times daily. 30 g Evadna Donaghy,  Concepcion Kirkpatrick C, PA-C   nystatin (MYCOSTATIN/NYSTOP) powder Apply 1 application topically 3 (three) times daily. 60 g Saliha Salts, [place] C, PA-C      PDMP not reviewed this encounter.   Lew Dawes, [place] 03/04/21 1151

## 2021-03-05 LAB — NOVEL CORONAVIRUS, NAA: SARS-CoV-2, NAA: NOT DETECTED

## 2021-03-05 LAB — SARS-COV-2, NAA 2 DAY TAT

## 2021-03-21 ENCOUNTER — Emergency Department (HOSPITAL_COMMUNITY): Payer: Self-pay

## 2021-03-21 ENCOUNTER — Other Ambulatory Visit: Payer: Self-pay

## 2021-03-21 ENCOUNTER — Encounter (HOSPITAL_COMMUNITY): Payer: Self-pay | Admitting: Emergency Medicine

## 2021-03-21 ENCOUNTER — Emergency Department (HOSPITAL_COMMUNITY)
Admission: EM | Admit: 2021-03-21 | Discharge: 2021-03-22 | Disposition: A | Discharge status: Home / Self Care | Payer: Self-pay | Attending: Emergency Medicine | Admitting: Emergency Medicine

## 2021-03-21 DIAGNOSIS — F17210 Nicotine dependence, cigarettes, uncomplicated: Secondary | ICD-10-CM | POA: Insufficient documentation

## 2021-03-21 DIAGNOSIS — F1120 Opioid dependence, uncomplicated: Secondary | ICD-10-CM | POA: Insufficient documentation

## 2021-03-21 DIAGNOSIS — R945 Abnormal results of liver function studies: Secondary | ICD-10-CM | POA: Insufficient documentation

## 2021-03-21 DIAGNOSIS — K5900 Constipation, unspecified: Secondary | ICD-10-CM

## 2021-03-21 DIAGNOSIS — F1520 Other stimulant dependence, uncomplicated: Secondary | ICD-10-CM | POA: Insufficient documentation

## 2021-03-21 DIAGNOSIS — R7989 Other specified abnormal findings of blood chemistry: Secondary | ICD-10-CM

## 2021-03-21 DIAGNOSIS — F333 Major depressive disorder, recurrent, severe with psychotic symptoms: Secondary | ICD-10-CM | POA: Insufficient documentation

## 2021-03-21 DIAGNOSIS — Y900 Blood alcohol level of less than 20 mg/100 ml: Secondary | ICD-10-CM | POA: Insufficient documentation

## 2021-03-21 LAB — CBC
HCT: 39.5 % (ref 39.0–52.0)
Hemoglobin: 12.7 g/dL — ABNORMAL LOW (ref 13.0–17.0)
MCH: 29.3 pg (ref 26.0–34.0)
MCHC: 32.2 g/dL (ref 30.0–36.0)
MCV: 91 fL (ref 80.0–100.0)
Platelets: 246 10*3/uL (ref 150–400)
RBC: 4.34 MIL/uL (ref 4.22–5.81)
RDW: 16 % — ABNORMAL HIGH (ref 11.5–15.5)
WBC: 5 10*3/uL (ref 4.0–10.5)
nRBC: 0 % (ref 0.0–0.2)

## 2021-03-21 LAB — ETHANOL: Alcohol, Ethyl (B): <10 mg/dL (ref <10)

## 2021-03-21 LAB — RAPID URINE DRUG SCREEN, HOSP PERFORMED
Amphetamines: NOT DETECTED
Barbiturates: NOT DETECTED
Benzodiazepines: NOT DETECTED
Cocaine: NOT DETECTED
Opiates: NOT DETECTED
Tetrahydrocannabinol: NOT DETECTED

## 2021-03-21 LAB — COMPREHENSIVE METABOLIC PANEL
ALT: 717 U/L — ABNORMAL HIGH (ref 0–44)
AST: 552 U/L — ABNORMAL HIGH (ref 15–41)
Albumin: 3.3 g/dL — ABNORMAL LOW (ref 3.5–5.0)
Alkaline Phosphatase: 167 U/L — ABNORMAL HIGH (ref 38–126)
Anion gap: 8 (ref 5–15)
BUN: 11 mg/dL (ref 6–20)
CO2: 29 mmol/L (ref 22–32)
Calcium: 8.6 mg/dL — ABNORMAL LOW (ref 8.9–10.3)
Chloride: 100 mmol/L (ref 98–111)
Creatinine, Ser: 0.79 mg/dL (ref 0.61–1.24)
GFR, Estimated: >60 mL/min (ref >60)
Glucose, Bld: 146 mg/dL — ABNORMAL HIGH (ref 70–99)
Potassium: 4.3 mmol/L (ref 3.5–5.1)
Sodium: 137 mmol/L (ref 135–145)
Total Bilirubin: 1.6 mg/dL — ABNORMAL HIGH (ref 0.3–1.2)
Total Protein: 7 g/dL (ref 6.5–8.1)

## 2021-03-21 LAB — SALICYLATE LEVEL: Salicylate Lvl: <7 mg/dL — ABNORMAL LOW (ref 7.0–30.0)

## 2021-03-21 LAB — ACETAMINOPHEN LEVEL: Acetaminophen (Tylenol), Serum: <10 ug/mL — ABNORMAL LOW (ref 10–30)

## 2021-03-21 MED ORDER — POLYETHYLENE GLYCOL 3350 17 G PO PACK
17.0000 g | PACK | Freq: Two times a day (BID) | ORAL | Status: DC
  Administered 2021-03-21: 17 g via ORAL
  Filled 2021-03-21: qty 1

## 2021-03-21 MED ORDER — SODIUM CHLORIDE 0.9 % IV BOLUS
1000.0000 mL | Freq: Once | INTRAVENOUS | Status: AC
  Administered 2021-03-21: 1000 mL via INTRAVENOUS

## 2021-03-21 MED ORDER — LORAZEPAM 1 MG PO TABS: 1.0000 mg | ORAL_TABLET | ORAL | Status: DC | PRN

## 2021-03-21 MED ORDER — SODIUM CHLORIDE 0.9 % IV BOLUS: 1000.0000 mL | Freq: Once | INTRAVENOUS | Status: DC

## 2021-03-21 MED ORDER — IOHEXOL 350 MG/ML SOLN
80.0000 mL | Freq: Once | INTRAVENOUS | Status: AC | PRN
  Administered 2021-03-21: 80 mL via INTRAVENOUS

## 2021-03-21 NOTE — ED Triage Notes (Signed)
Patient states recently out of jail. Patient was sleeping in the woods when he thinks he got a tape worm. Patient denies any abdominal complaints, but feels like he can feel is "snaking through" his intestines.   Patient would also like a mental health evaluation. Denies, SI/HI/AVH.
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
## 2021-03-21 NOTE — ED Notes (Signed)
Pt currently in TTS assessment.

## 2021-03-21 NOTE — ED Provider Notes (Addendum)
[facility]-EMERGENCY DEPT Provider Note   CSN: [id number] Arrival date & time: 03/21/21  2012     History Chief Complaint  Patient presents with   Mental Health Problem    Christopher Sanders is a 45 y.o. male history of polysubstance abuse here presenting with hallucinations, concern for possible tapeworm and needing full medical evaluation.  Patient states that he has been having auditory hallucinations.  He states that the voices are very troubling him but not telling him to kill himself.  He states that he is also homeless.  In fact, patient just got out of jail for 3 weeks.  He states that about 4 weeks ago, he noticed possible tapeworm.  He described that may be a worm has crawled into his rectum and went through his whole intestines.  He states that he has not feel the worms move and he denies any vomiting.  He states that the symptoms have resolved since he has been in jail for the last 3 weeks.  Patient just got his jail and has nowhere to go and wanted a full evaluation.  Denies any abdominal pain.  Patient states that he has not been using any substances while he was in jail.  The history is provided by the patient.      Past Medical History:  Diagnosis Date   Medical history non-contributory    Polysubstance abuse [facility])     Patient Active Problem List   Diagnosis Date Noted   Drug overdose 09/06/2019   Encounter for intubation    Hypokalemia    Polysubstance abuse (HCC)    Acute encephalopathy 07/18/2019    History reviewed. No pertinent surgical history.     Family History  Family history unknown: Yes    Social History   Tobacco Use   Smoking status: Every Day    Packs/day: 1.00    Types: Cigarettes   Smokeless tobacco: Current    Types: Snuff  Substance Use Topics   Alcohol use: No   Drug use: Yes    Comment: ghb/heroin    Home Medications Prior to Admission medications   Medication Sig Start Date End Date Taking? Authorizing  Provider  mupirocin ointment (BACTROBAN) 2 % Apply 1 application topically 2 (two) times daily. 03/04/21   Wieters, Hallie C, PA-C  nystatin (MYCOSTATIN/NYSTOP) powder Apply 1 application topically 3 (three) times daily. 03/04/21   Wieters, Hallie C, PA-C    Allergies    Patient has no known allergies.  Review of Systems   Review of Systems  Gastrointestinal:        Worm in stool   Psychiatric/Behavioral:  Positive for hallucinations.   All other systems reviewed and are negative.  Physical Exam Updated Vital Signs BP 121/83   Pulse 79   Temp 98.6 F (37 C) (Oral)   Resp 18   SpO2 98%   Physical Exam Vitals and nursing note reviewed.  Constitutional:      Comments: Anxious  HENT:     Head: Normocephalic.     Nose: Nose normal.     Mouth/Throat:     Mouth: Mucous membranes are moist.  Eyes:     Extraocular Movements: Extraocular movements intact.     Pupils: Pupils are equal, round, and reactive to light.  Cardiovascular:     Rate and Rhythm: Normal rate and regular rhythm.     Pulses: Normal pulses.     Heart sounds: Normal heart sounds.  Pulmonary:     Effort:  Pulmonary effort is normal.     Breath sounds: Normal breath sounds.  Abdominal:     General: Abdomen is flat.     Palpations: Abdomen is soft.  Genitourinary:    Comments: Rectal exam showed no obvious hemorrhoids and no obvious signs of trauma.  There is no worms visible Musculoskeletal:        General: Normal range of motion.     Cervical back: Normal range of motion and neck supple.     Comments: Patient has some dry skin on bottom part of bilateral feet.  Patient has no obvious signs of fungal infection  Skin:    General: Skin is warm.     Capillary Refill: Capillary refill takes less than 2 seconds.  Neurological:     General: No focal deficit present.     Mental Status: He is oriented to person, place, and time.  Psychiatric:        Mood and Affect: Mood normal.        Behavior: Behavior normal.     ED Results / Procedures / Treatments   Labs (all labs ordered are listed, but only abnormal results are displayed) Labs Reviewed  COMPREHENSIVE METABOLIC PANEL - Abnormal; Notable for the following components:      Result Value   Glucose, Bld 146 (*)    Calcium 8.6 (*)    Albumin 3.3 (*)    AST 552 (*)    ALT 717 (*)    Alkaline Phosphatase 167 (*)    Total Bilirubin 1.6 (*)    All other components within normal limits  SALICYLATE LEVEL - Abnormal; Notable for the following components:   Salicylate Lvl <7.0 (*)    All other components within normal limits  ACETAMINOPHEN LEVEL - Abnormal; Notable for the following components:   Acetaminophen (Tylenol), Serum <10 (*)    All other components within normal limits  CBC - Abnormal; Notable for the following components:   Hemoglobin 12.7 (*)    RDW 16.0 (*)    All other components within normal limits  ETHANOL  RAPID URINE DRUG SCREEN, HOSP PERFORMED  HEPATITIS PANEL, ACUTE    EKG None  Radiology CT ABDOMEN PELVIS W CONTRAST  Result Date: 03/21/2021 CLINICAL DATA:  Abdominal distension and elevated LFTs. EXAM: CT ABDOMEN AND PELVIS WITH CONTRAST TECHNIQUE: Multidetector CT imaging of the abdomen and pelvis was performed using the standard protocol following bolus administration of intravenous contrast. CONTRAST:  80mL OMNIPAQUE IOHEXOL 350 MG/ML SOLN COMPARISON:  Radiograph earlier today. FINDINGS: Lower chest: No pleural effusion or acute airspace disease. The heart is normal in size. Hepatobiliary: Nonspecific subcentimeter hypodensity in the right dome of the liver, series 2, image 8. Liver is enlarged spanning 24.5 cm cranial caudal. Minimal periportal edema. Gallbladder is decompressed. No calcified gallstone. No biliary dilatation. Pancreas: No ductal dilatation or inflammation. Spleen: Mild splenomegaly with spleen spanning 13.4 x 6.2 x 13.5 cm (volume = 590 cm^3). No focal abnormality. Adrenals/Urinary Tract: Normal adrenal  glands. No hydronephrosis or perinephric edema. Homogeneous renal enhancement with symmetric excretion on delayed phase imaging. Urinary bladder is physiologically distended without wall thickening. Stomach/Bowel: Detailed bowel assessment is limited in the absence of enteric contrast and paucity of intra-abdominal fat. Ingested material within the stomach. There is no small bowel obstruction or inflammatory change. Normal appendix. There is mild fecalization of distal small bowel contents. Moderate colonic stool burden. No colonic wall thickening or inflammation. Occasional left colonic diverticula without diverticulitis. Vascular/Lymphatic: Normal caliber abdominal aorta.  Patent portal vein. No portal venous or mesenteric gas. No bulky abdominopelvic adenopathy. Reproductive: Prostate is unremarkable. Other: No free air, free fluid, or intra-abdominal fluid collection. Mild edema of the subcutaneous and intra-abdominal fat. Musculoskeletal: There are no acute or suspicious osseous abnormalities. IMPRESSION: 1. Hepatosplenomegaly. Minimal periportal edema may be related to hydration status. 2. Subcentimeter hypodensity in the right dome of the liver, too small to characterize, typically cyst. 3. Moderate colonic stool burden with fecalization of distal small bowel contents, suggesting slow transit. No bowel obstruction or inflammation. 4. Minimal left colonic diverticulosis without diverticulitis. Electronically Signed   By: Narda Rutherford M.D.   On: 03/21/2021 23:26   DG ABD ACUTE 2+V W 1V CHEST  Result Date: 03/21/2021 CLINICAL DATA:  Constipation. EXAM: DG ABDOMEN ACUTE WITH 1 VIEW CHEST COMPARISON:  Chest radiograph dated 09/05/2019. FINDINGS: The lungs are clear. There is no pleural effusion or pneumothorax. The cardiac silhouette is within normal limits. Moderate stool throughout the colon. No bowel dilatation or evidence of obstruction. No free air or radiopaque calculi. The osseous structures are  intact. The soft tissues are unremarkable. IMPRESSION: 1. No acute cardiopulmonary process. 2. Moderate colonic stool burden.  No bowel obstruction. Electronically Signed   By: Elgie Collard M.D.   On: 03/21/2021 21:15    Procedures Procedures   Medications Ordered in ED Medications  polyethylene glycol (MIRALAX / GLYCOLAX) packet 17 g (17 g Oral Given 03/21/21 2327)  sodium chloride 0.9 % bolus 1,000 mL (1,000 mLs Intravenous New Bag/Given 03/21/21 2301)  iohexol (OMNIPAQUE) 350 MG/ML injection 80 mL (80 mLs Intravenous Contrast Given 03/21/21 2308)    ED Course  I have reviewed the triage vital signs and the nursing notes.  Pertinent labs & imaging results that were available during my care of the patient were reviewed by me and considered in my medical decision making (see chart for details).    MDM Rules/Calculators/A&P                           Christopher Sanders is a 45 y.o. male here with constellation of complaints.  Patient was concern for possible worms in his intestines but that happened about 3 weeks ago. I do not see any worms on my rectal exam.  And the symptoms have resolved since he is in jail for the last 3 weeks.  Patient is hearing voices however.  At this point, will get medical clearance labs and consult TTS.  I do not think he has tapeworm at this point and I do not think he needs CT abdomen pelvis    11:52 PM White blood cell count is 5.  However patient's LFTs are elevated.  AST is 552 and ALT is 700.  CT showed hepatosplenomegaly.  Patient has small liver cysts but no obvious liver abscess or mass to explain elevated LFTs.  Patient is constipated on CT so will start on MiraLAX.  Patient's Tylenol level is negative.  Patient is in jail for 3 weeks so is not drinking alcohol and alcohol level is negative.  Acute hepatitis panel is sent off.  Patient has no abdominal tenderness.  I think at this point, will repeat LFTs in AM and if LFTs stable, patient can get outpatient GI  follow-up outpatient.   Final Clinical Impression(s) / ED Diagnoses Final diagnoses:  None    Rx / DC Orders ED Discharge Orders     None  Charlynne Pander, MD 03/21/21 2354    Charlynne Pander, MD 03/22/21 [phone]

## 2021-03-22 NOTE — ED Notes (Signed)
Pt left AMA. MD notified.  

## 2021-03-22 NOTE — ED Provider Notes (Signed)
Patient to the ED with concern for auditory hallucinations and possible tapeworm.  TTS recommended overnight observation.  Nursing staff reported patient wanted to leave. He was not suicidal or homicidal by report and not under IVC.  Patient left the ED before I could speak with him.   Glynn Octave, MD 03/22/21 [phone]

## 2021-03-23 LAB — HEPATITIS PANEL, ACUTE
HCV Ab: REACTIVE — AB
Hep A IgM: NONREACTIVE
Hep B C IgM: REACTIVE — AB
Hepatitis B Surface Ag: REACTIVE — AB

## 2021-04-06 ENCOUNTER — Encounter (HOSPITAL_COMMUNITY): Payer: Self-pay

## 2021-04-06 ENCOUNTER — Emergency Department (HOSPITAL_COMMUNITY)
Admission: EM | Admit: 2021-04-06 | Discharge: 2021-04-06 | Disposition: A | Discharge status: Home / Self Care | Payer: Self-pay | Attending: Emergency Medicine | Admitting: Emergency Medicine

## 2021-04-06 ENCOUNTER — Other Ambulatory Visit: Payer: Self-pay

## 2021-04-06 DIAGNOSIS — Z5321 Procedure and treatment not carried out due to patient leaving prior to being seen by health care provider: Secondary | ICD-10-CM | POA: Insufficient documentation

## 2021-04-06 DIAGNOSIS — Z733 Stress, not elsewhere classified: Secondary | ICD-10-CM | POA: Insufficient documentation

## 2021-04-06 DIAGNOSIS — F419 Anxiety disorder, unspecified: Secondary | ICD-10-CM | POA: Insufficient documentation

## 2021-04-06 NOTE — ED Notes (Signed)
No answer from pt when called from lobby. Checked lobby and surrounding areas and pt not seen.

## 2021-04-06 NOTE — ED Triage Notes (Signed)
Per patient increase stress and anxiety due to relationship issues. Denies SI/HI/AVH

## 2021-04-06 NOTE — ED Notes (Signed)
Pt seen exiting the ER doors. Attempted to talk to pt and he continued to walk out and ignored this [employer].

## 2021-05-05 ENCOUNTER — Emergency Department (HOSPITAL_BASED_OUTPATIENT_CLINIC_OR_DEPARTMENT_OTHER)
Admission: EM | Admit: 2021-05-05 | Discharge: 2021-05-05 | Disposition: A | Discharge status: Home / Self Care | Payer: Self-pay | Attending: Emergency Medicine | Admitting: Emergency Medicine

## 2021-05-05 DIAGNOSIS — R109 Unspecified abdominal pain: Secondary | ICD-10-CM | POA: Insufficient documentation

## 2021-05-05 DIAGNOSIS — R300 Dysuria: Secondary | ICD-10-CM | POA: Insufficient documentation

## 2021-05-05 DIAGNOSIS — F17210 Nicotine dependence, cigarettes, uncomplicated: Secondary | ICD-10-CM | POA: Insufficient documentation

## 2021-05-05 DIAGNOSIS — M549 Dorsalgia, unspecified: Secondary | ICD-10-CM | POA: Insufficient documentation
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
## 2021-05-05 NOTE — ED Triage Notes (Signed)
Pt came in via [facility] with c/o painful urination and R flank pain that has been ongoing for approx one week. Pt states that he is not currently sexually active.

## 2021-05-05 NOTE — Discharge Instructions (Addendum)
You are leaving prior to any evaluation or treatment.  We discussed that we cannot diagnose any specific issues tonight and this could result in short and long term complications, ultimately resulting in death.  If at any point you change your mind or feel worse you are encouraged to return to this or any other hospital.

## 2023-06-25 IMAGING — CT CT ABD-PELV W/ CM
3 of 5 series · 16 of 46 positions shown, 18 images · IV contrast (omnipaque)
Comparison: Radiograph earlier today.

CLINICAL DATA: Abdominal distension and elevated LFTs.

EXAM:
CT ABDOMEN AND PELVIS WITH CONTRAST
TECHNIQUE: Multidetector CT imaging of the abdomen and pelvis was performed
using the standard protocol following bolus administration of
intravenous contrast.
CONTRAST:  80mL OMNIPAQUE IOHEXOL 350 MG/ML SOLN

[Series 2: axial st · axial · 0.76mm/px · z∈[-549,-149]mm · 10 of 98 slices shown, 12 images]
[im 9/98  soft-tissue]
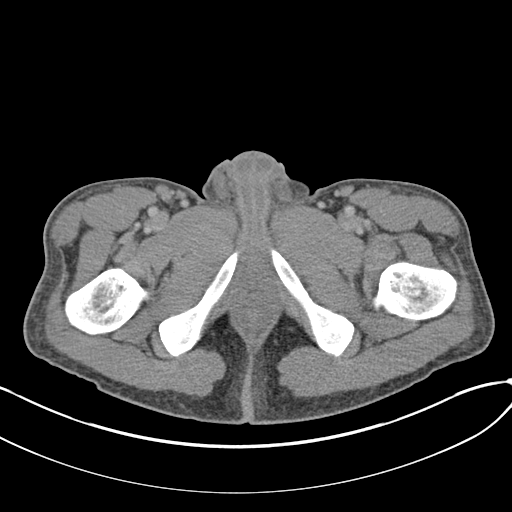
[im 9/98  bone]
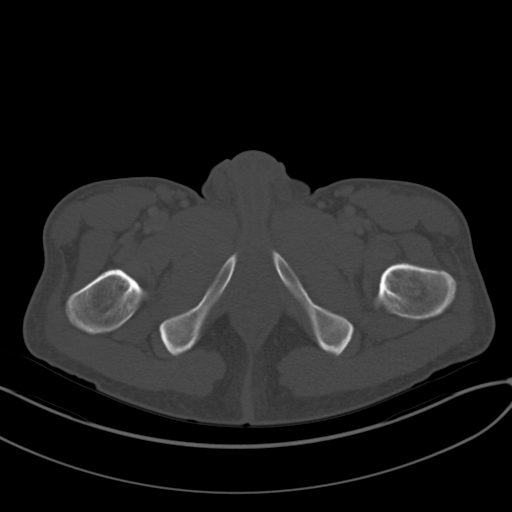
[im 18/98  soft-tissue]
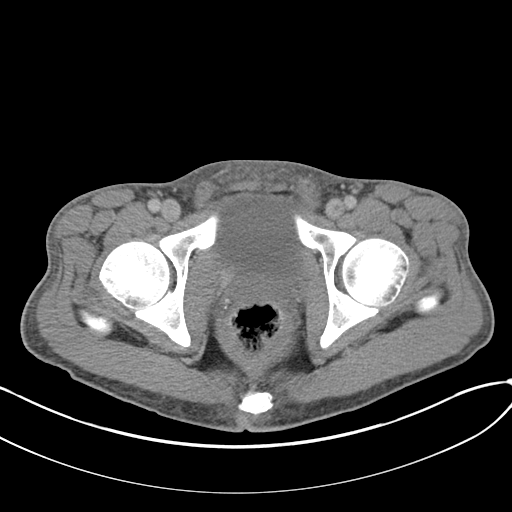
[im 27/98  soft-tissue]
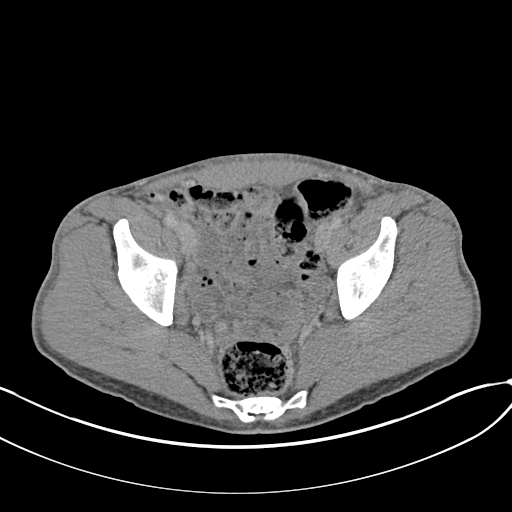
[im 36/98  soft-tissue]
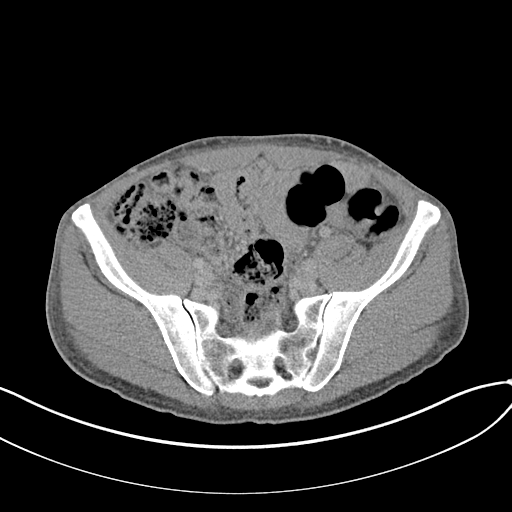
[im 45/98  soft-tissue]
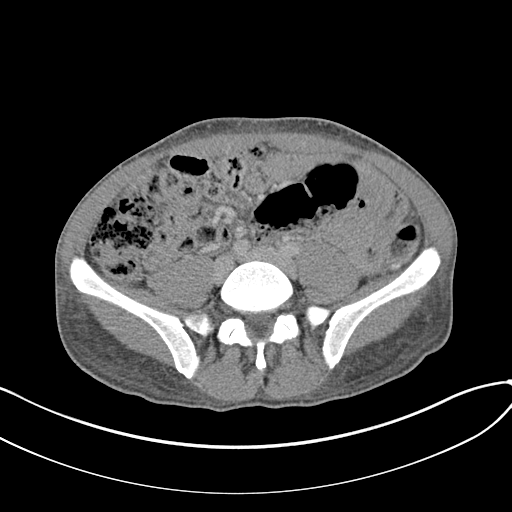
[im 53/98  soft-tissue]
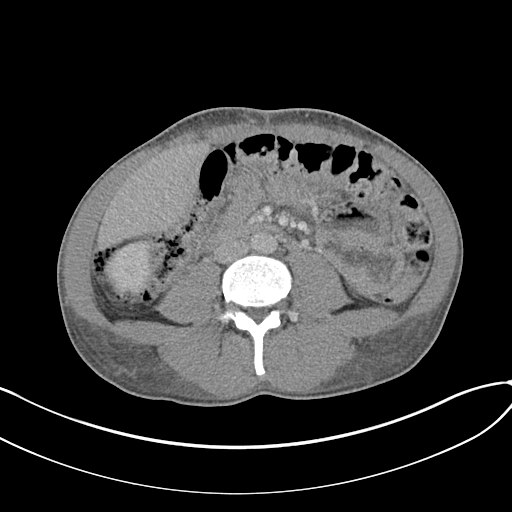
[im 62/98  soft-tissue]
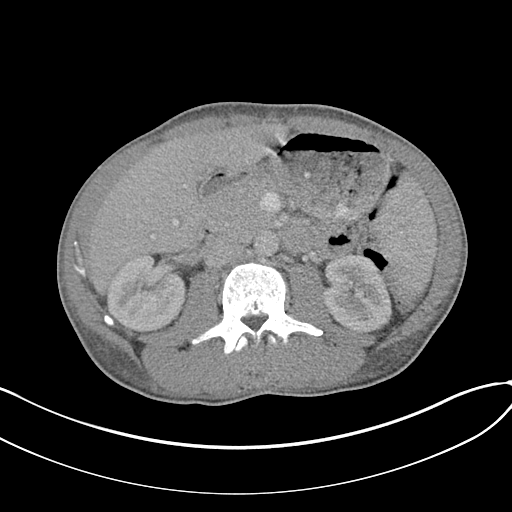
[im 71/98  soft-tissue]
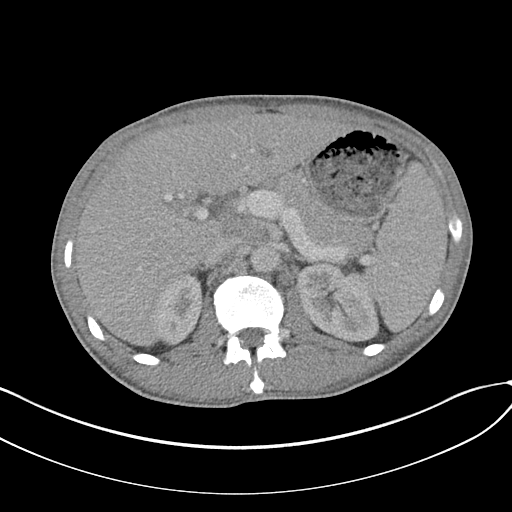
[im 80/98  soft-tissue]
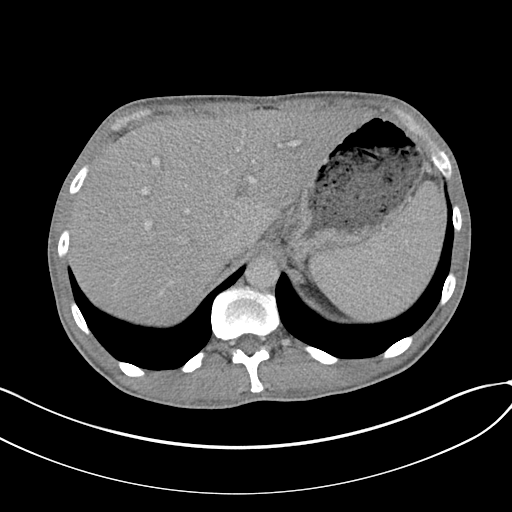
[im 80/98  bone]
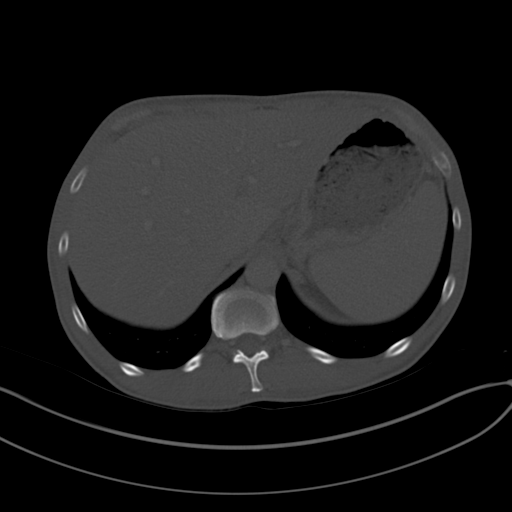
[im 89/98  soft-tissue]
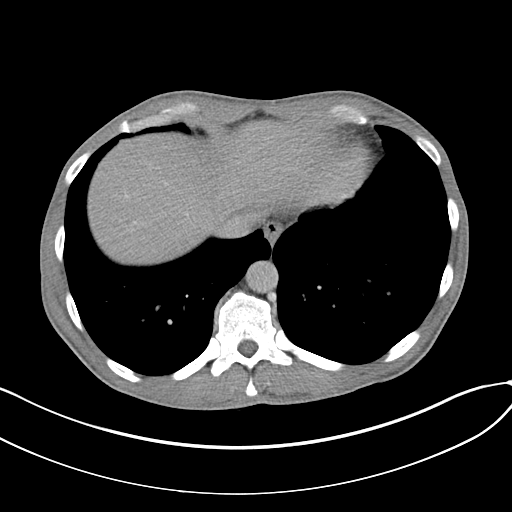

[Series 4: coronal st · coronal · 0.82mm/px · 3 of 149 slices shown]
[im 50/149  soft-tissue]
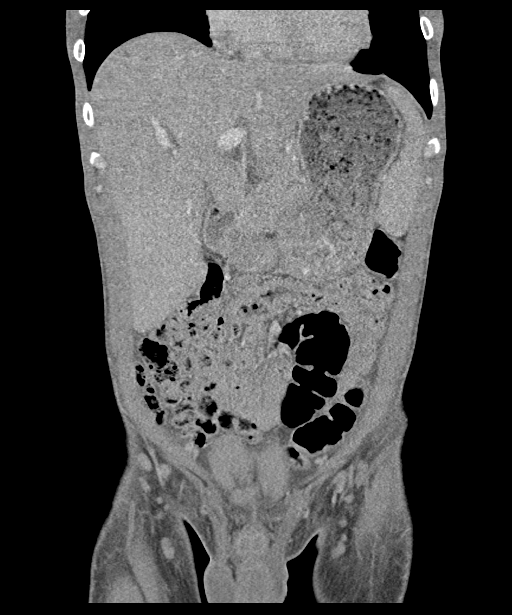
[im 66/149  soft-tissue]
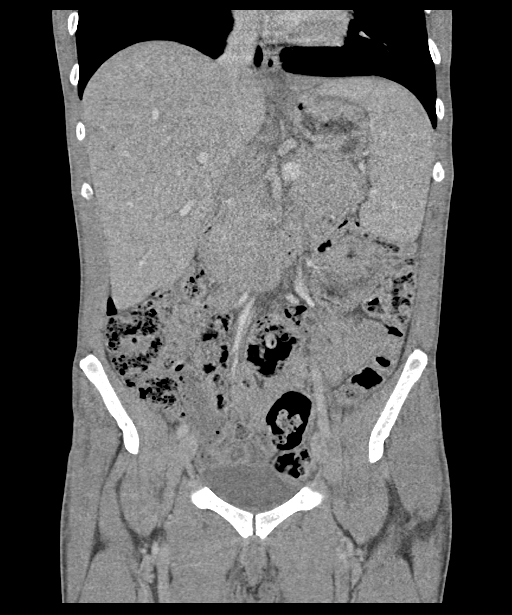
[im 83/149  soft-tissue]
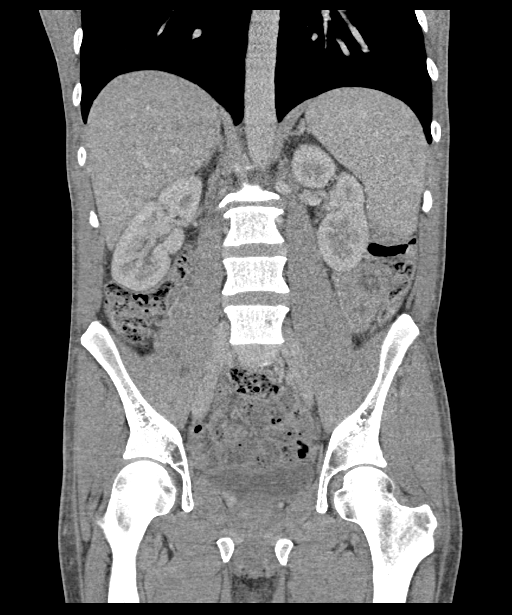

[Series 6: lung bases · axial · 0.76mm/px · z∈[-314,-262]mm · 3 of 114 slices shown]
[im 9/114  bone]
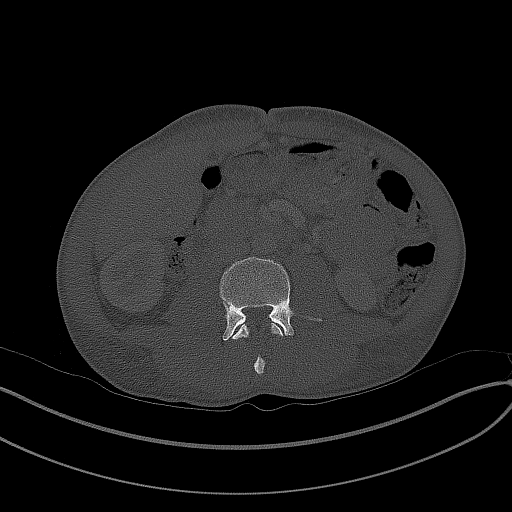
[im 27/114  bone]
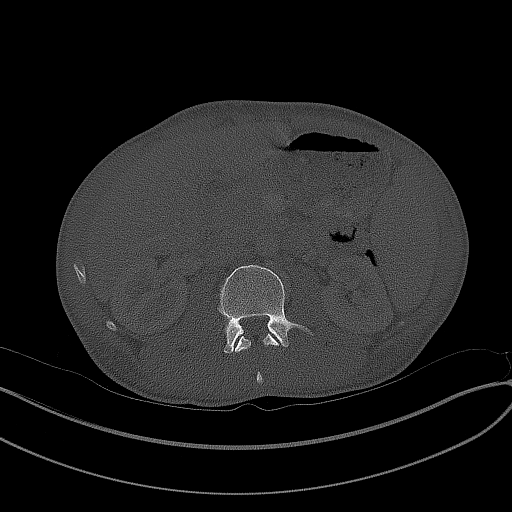
[im 35/114  bone]
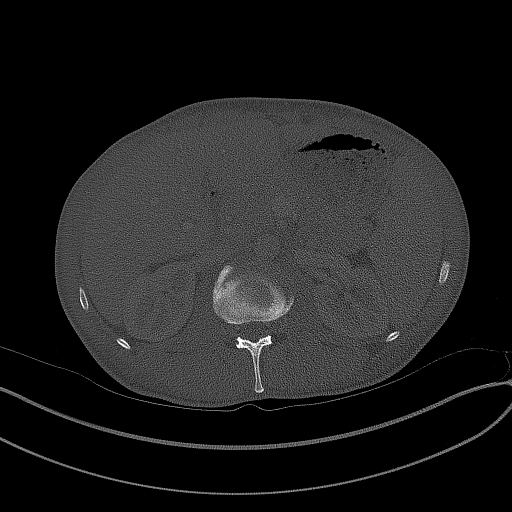

[16 of 46 positions shown; findings below may reference images not displayed]

FINDINGS: Lower chest: No pleural effusion or acute airspace disease. The
heart is normal in size.

Hepatobiliary: Nonspecific subcentimeter hypodensity in the right
dome of the liver, series 2, image 8. Liver is enlarged spanning
24.5 cm cranial caudal. Minimal periportal edema. Gallbladder is
decompressed. No calcified gallstone. No biliary dilatation.

Pancreas: No ductal dilatation or inflammation.

Spleen: Mild splenomegaly with spleen spanning 13.4 x 6.2 x 13.5 cm
(volume = 590 cm^3). No focal abnormality.

Adrenals/Urinary Tract: Normal adrenal glands. No hydronephrosis or
perinephric edema. Homogeneous renal enhancement with symmetric
excretion on delayed phase imaging. Urinary bladder is
physiologically distended without wall thickening.

Stomach/Bowel: Detailed bowel assessment is limited in the absence
of enteric contrast and paucity of intra-abdominal fat. Ingested
material within the stomach. There is no small bowel obstruction or
inflammatory change. Normal appendix. There is mild fecalization of
distal small bowel contents. Moderate colonic stool burden. No
colonic wall thickening or inflammation. Occasional left colonic
diverticula without diverticulitis.

Vascular/Lymphatic: Normal caliber abdominal aorta. Patent portal
vein. No portal venous or mesenteric gas. No bulky abdominopelvic
adenopathy.

Reproductive: Prostate is unremarkable.

Other: No free air, free fluid, or intra-abdominal fluid collection.
Mild edema of the subcutaneous and intra-abdominal fat.

Musculoskeletal: There are no acute or suspicious osseous
abnormalities.
IMPRESSION: 1. Hepatosplenomegaly. Minimal periportal edema may be related to
hydration status.
2. Subcentimeter hypodensity in the right dome of the liver, too
small to characterize, typically cyst.
3. Moderate colonic stool burden with fecalization of distal small
bowel contents, suggesting slow transit. No bowel obstruction or
inflammation.
4. Minimal left colonic diverticulosis without diverticulitis.
